# Patient Record
Sex: Male | Born: 1945 | Race: White | Hispanic: No | Marital: Married | Smoking: Former smoker
Health system: Southern US, Community
[De-identification: ages and names within clinical notes are randomized; demographics above are authoritative.]

## PROBLEM LIST (undated history)

## (undated) DIAGNOSIS — G249 Dystonia, unspecified: Secondary | ICD-10-CM

## (undated) DIAGNOSIS — I10 Essential (primary) hypertension: Secondary | ICD-10-CM

## (undated) DIAGNOSIS — E785 Hyperlipidemia, unspecified: Secondary | ICD-10-CM

## (undated) DIAGNOSIS — K219 Gastro-esophageal reflux disease without esophagitis: Secondary | ICD-10-CM

## (undated) HISTORY — DX: Dystonia, unspecified: G24.9

## (undated) HISTORY — DX: Essential (primary) hypertension: I10

## (undated) HISTORY — DX: Gastro-esophageal reflux disease without esophagitis: K21.9

## (undated) HISTORY — PX: HERNIA REPAIR: SHX51

## (undated) HISTORY — DX: Hyperlipidemia, unspecified: E78.5

---

## 1998-05-23 ENCOUNTER — Ambulatory Visit: Admission: RE | Admit: 1998-05-23 | Discharge: 1998-05-23 | Payer: Self-pay | Admitting: Family Medicine

## 1998-08-31 ENCOUNTER — Ambulatory Visit: Admission: RE | Admit: 1998-08-31 | Discharge: 1998-08-31 | Payer: Self-pay | Admitting: Family Medicine

## 2001-09-12 ENCOUNTER — Encounter: Admission: RE | Admit: 2001-09-12 | Discharge: 2001-09-12 | Payer: Self-pay | Admitting: Family Medicine

## 2001-09-12 ENCOUNTER — Encounter: Payer: Self-pay | Admitting: Family Medicine

## 2003-07-11 ENCOUNTER — Encounter: Admission: RE | Admit: 2003-07-11 | Discharge: 2003-07-11 | Payer: Self-pay | Admitting: Gastroenterology

## 2016-06-18 DIAGNOSIS — H2513 Age-related nuclear cataract, bilateral: Secondary | ICD-10-CM | POA: Diagnosis not present

## 2016-06-18 DIAGNOSIS — H1859 Other hereditary corneal dystrophies: Secondary | ICD-10-CM | POA: Diagnosis not present

## 2016-06-26 ENCOUNTER — Other Ambulatory Visit: Payer: Self-pay | Admitting: Family Medicine

## 2016-06-26 DIAGNOSIS — Z0000 Encounter for general adult medical examination without abnormal findings: Secondary | ICD-10-CM | POA: Diagnosis not present

## 2016-06-26 DIAGNOSIS — Z1159 Encounter for screening for other viral diseases: Secondary | ICD-10-CM | POA: Diagnosis not present

## 2016-06-26 DIAGNOSIS — K219 Gastro-esophageal reflux disease without esophagitis: Secondary | ICD-10-CM | POA: Diagnosis not present

## 2016-06-26 DIAGNOSIS — R5383 Other fatigue: Secondary | ICD-10-CM | POA: Diagnosis not present

## 2016-06-26 DIAGNOSIS — R4781 Slurred speech: Secondary | ICD-10-CM | POA: Diagnosis not present

## 2016-06-26 DIAGNOSIS — I129 Hypertensive chronic kidney disease with stage 1 through stage 4 chronic kidney disease, or unspecified chronic kidney disease: Secondary | ICD-10-CM | POA: Diagnosis not present

## 2016-06-26 DIAGNOSIS — E782 Mixed hyperlipidemia: Secondary | ICD-10-CM | POA: Diagnosis not present

## 2016-06-26 DIAGNOSIS — Z125 Encounter for screening for malignant neoplasm of prostate: Secondary | ICD-10-CM | POA: Diagnosis not present

## 2016-06-26 DIAGNOSIS — R269 Unspecified abnormalities of gait and mobility: Secondary | ICD-10-CM | POA: Diagnosis not present

## 2016-06-26 DIAGNOSIS — N183 Chronic kidney disease, stage 3 (moderate): Secondary | ICD-10-CM | POA: Diagnosis not present

## 2016-07-06 ENCOUNTER — Ambulatory Visit
Admission: RE | Admit: 2016-07-06 | Discharge: 2016-07-06 | Disposition: A | Discharge status: Home / Self Care | Payer: Medicare Other | Source: Ambulatory Visit | Attending: Family Medicine | Admitting: Family Medicine

## 2016-07-06 DIAGNOSIS — R4781 Slurred speech: Secondary | ICD-10-CM | POA: Diagnosis not present

## 2016-07-06 MED ORDER — GADOBENATE DIMEGLUMINE 529 MG/ML IV SOLN
16.0000 mL | Freq: Once | INTRAVENOUS | Status: AC | PRN
  Administered 2016-07-06: 16 mL via INTRAVENOUS

## 2016-07-13 DIAGNOSIS — N179 Acute kidney failure, unspecified: Secondary | ICD-10-CM | POA: Diagnosis not present

## 2016-07-13 DIAGNOSIS — N183 Chronic kidney disease, stage 3 (moderate): Secondary | ICD-10-CM | POA: Diagnosis not present

## 2016-08-04 ENCOUNTER — Encounter: Payer: Self-pay | Admitting: Neurology

## 2016-08-04 ENCOUNTER — Other Ambulatory Visit: Payer: Medicare Other

## 2016-08-04 ENCOUNTER — Ambulatory Visit (INDEPENDENT_AMBULATORY_CARE_PROVIDER_SITE_OTHER): Payer: Medicare Other | Admitting: Neurology

## 2016-08-04 VITALS — BP 108/64 | HR 82 | Ht 68.0 in | Wt 179.5 lb

## 2016-08-04 DIAGNOSIS — R2681 Unsteadiness on feet: Secondary | ICD-10-CM

## 2016-08-04 DIAGNOSIS — G629 Polyneuropathy, unspecified: Secondary | ICD-10-CM | POA: Diagnosis not present

## 2016-08-04 LAB — VITAMIN B12: VITAMIN B 12: 719 pg/mL (ref 200–1100)

## 2016-08-04 NOTE — Patient Instructions (Signed)
1. Schedule MRI lumbar spine without contrast 2. Schedule EMG/NCV of both lower extremities with Dr. Allena KatzPatel 3. Bloodwork for CK, RF, aldolase, B12 4. Follow-up after tests

## 2016-08-04 NOTE — Progress Notes (Signed)
NEUROLOGY CONSULTATION NOTE  Joseph Richards MRN: [id number] DOB: [birth year]  Referring provider: Dr. Lupita Raider Primary care provider: Dr. Lupita Raider   Reason for consult:  Gait difficulties  Dear Dr Clelia Croft:  Thank you for your kind referral of Joseph Richards for consultation of the above symptoms. Although his history is well known to you, please allow me to reiterate it for the purpose of our medical record. The patient was accompanied to the clinic by his wife who also provides collateral information. Records and images were personally reviewed where available.  HISTORY OF PRESENT ILLNESS: This is a pleasant 71 year old left-handed man with a history of hypertension, hyperlipidemia, presenting for evaluation of gait difficulties. He and his wife report that he has always had "somewhat of a limp" that becomes more pronounced throughout the day, but over the past 10-12 months, he started having problems where he would feel stiffness in his legs after he sits for a period of time then gets up to walk. He does not necessarily feel it his his joints, he indicates the stiffness and tightness appears to be in his upper thighs extending down the knee when he gets up. He would be awkward when he first gets up, but gets better as the day goes on. He denies any numbness or tingling, no back pain or neck pain. His wife reports that he does have back pain and feels he will need a walker within the year. They deny any falls. No bowel/bladder dysfunction. They have been married more than 38 years and she has always noticed he drags the right foot a little more. He states right foot difficulties were from injury from a pitchfork when he was a child. He has always had difficulties going down stairs. He has a poor sense of balance but is gotten worse. Recently she also noticed that he would have slurred speech that comes and goes a couple of times a day, worse later in the day. He notices he tends to  drool on the right side of his mouth sometimes. There is no family history of similar symptoms. He has occasional sinus headaches, no dizziness, diplopia, dysarthria/dysphagia. There was concern for some right sided facial asymmetry, not noticeable in the office today.   PAST MEDICAL HISTORY:   Past Medical History:  Diagnosis Date  . Hyperlipidemia   . Hypertension     PAST SURGICAL HISTORY: No past surgical history on file.  MEDICATIONS:  Outpatient Encounter Prescriptions as of 08/04/2016  Medication Sig  . aspirin EC 81 MG tablet Take 81 mg by mouth daily.  Marland Kitchen lisinopril (PRINIVIL,ZESTRIL) 10 MG tablet   . Omega-3 Fatty Acids (FISH OIL) 1000 MG CPDR Take by mouth.  Marland Kitchen omeprazole (PRILOSEC) 40 MG capsule   . pravastatin (PRAVACHOL) 20 MG tablet    No facility-administered encounter medications on file as of 08/04/2016.     No current outpatient prescriptions on file prior to visit.   No current facility-administered medications on file prior to visit.     ALLERGIES: No Known Allergies  FAMILY HISTORY: Family History  Problem Relation Age of Onset  . Cancer Sister     SOCIAL HISTORY: Social History   Social History  . Marital status: Married    Spouse name: N/A  . Number of children: N/A  . Years of education: N/A   Occupational History  . Not on file.   Social History Main Topics  . Smoking status: Former Games developer  . Smokeless  tobacco: Current User  . Alcohol use Yes  . Drug use: No  . Sexual activity: Not on file   Other Topics Concern  . Not on file   Social History Narrative  . No narrative on file    REVIEW OF SYSTEMS: Constitutional: No fevers, chills, or sweats, no generalized fatigue, change in appetite Eyes: No visual changes, double vision, eye pain Ear, nose and throat: No hearing loss, ear pain, nasal congestion, sore throat Cardiovascular: No chest pain, palpitations Respiratory:  No shortness of breath at rest or with exertion,  wheezes GastrointestinaI: No nausea, vomiting, diarrhea, abdominal pain, fecal incontinence Genitourinary:  No dysuria, urinary retention or frequency Musculoskeletal:  No neck pain, back pain Integumentary: No rash, pruritus, skin lesions Neurological: as above Psychiatric: No depression, insomnia, anxiety Endocrine: No palpitations, fatigue, diaphoresis, mood swings, change in appetite, change in weight, increased thirst Hematologic/Lymphatic:  No anemia, purpura, petechiae. Allergic/Immunologic: no itchy/runny eyes, nasal congestion, recent allergic reactions, rashes  PHYSICAL EXAM: Vitals:   08/04/16 1257  BP: 108/64  Pulse: 82   General: No acute distress Head:  Normocephalic/atraumatic Eyes: Fundoscopic exam shows bilateral sharp discs, no vessel changes, exudates, or hemorrhages Neck: supple, no paraspinal tenderness, full range of motion Back: No paraspinal tenderness Heart: regular rate and rhythm Lungs: Clear to auscultation bilaterally. Vascular: No carotid bruits. Skin/Extremities: No rash, no edema. Foot deformity noted with left big toe overlapping on 2nd toe Neurological Exam: Mental status: alert and oriented to person, place, and time, no dysarthria or aphasia, Fund of knowledge is appropriate.  Recent and remote memory are intact.  Attention and concentration are normal.    Able to name objects and repeat phrases. Cranial nerves: CN I: not tested CN II: pupils equal, round and reactive to light, visual fields intact, fundi unremarkable. CN III, IV, VI:  full range of motion, no nystagmus, no ptosis CN V: facial sensation intact CN VII: upper and lower face symmetric CN VIII: hearing intact to finger rub CN IX, X: gag intact, uvula midline CN XI: sternocleidomastoid and trapezius muscles intact CN XII: tongue midline Bulk & Tone: normal, no fasciculations. Motor: 5/5 throughout with no pronator drift. During muscle testing, he appears to have some dystonic  right foot inversion (not clearly reproducible at all times) Sensation: decreased vibration to ankles bilaterally, intact to light touch, cold, pin, and joint position sense.  No extinction to double simultaneous stimulation.  Romberg test negative Deep Tendon Reflexes: +1 both UE, brisk +2 bilateral patella, absent ankle jerks, no ankle clonus Plantar responses: downgoing bilaterally Cerebellar: no incoordination on finger to nose, heel to shin. No dysdiadochokinesia Gait: slow, small steps ambulating with knees bent and low clearance of both feet, unable to tandem walk Tremor: none  IMPRESSION: This is a pleasant 71 year old left-handed man with a history of hypertension, hyperlipidemia, presenting for gait difficulties. He has had chronic right foot problems that have caused him to limp, but symptoms have worsened over the past few years, where he notices stiffness in both legs when he first stands up. This improves with activity. 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 The etiology his symptoms is unclear with broad differential diagnosis, it may be multifactorial as well. Bloodwork for CK, RF, aldolase, and B12 will be ordered. EMG/NCV of both lower extremities will be done to further evaluate his symptoms,  MRI lumbar spine without contrast will be ordered to assess for underlying structural abnormality. He was advised to start physical therapy for gait/balance. He will follow-up after the tests.   Thank you for allowing me to participate in the care of this patient. Please do not hesitate to call for any questions or concerns.   Patrcia Dolly, M.D.  CC: Dr. Clelia Croft

## 2016-08-05 LAB — CK: Total CK: 84 U/L (ref 7–232)

## 2016-08-05 LAB — RHEUMATOID FACTOR: Rhuematoid fact SerPl-aCnc: <14 IU/mL (ref <14)

## 2016-08-10 ENCOUNTER — Telehealth: Payer: Self-pay

## 2016-08-10 NOTE — Telephone Encounter (Signed)
-----   Message from Van Clines, MD sent at 08/06/2016  8:41 AM EDT ----- Pls let him know bloodwork is normal, thanks

## 2016-08-10 NOTE — Telephone Encounter (Signed)
Clld pt - LMOVM that lab results are normal. DPR checked.

## 2016-08-11 ENCOUNTER — Encounter: Payer: Self-pay | Admitting: Neurology

## 2016-08-11 ENCOUNTER — Telehealth: Payer: Self-pay | Admitting: Neurology

## 2016-08-11 DIAGNOSIS — R2681 Unsteadiness on feet: Principal | ICD-10-CM

## 2016-08-11 DIAGNOSIS — R269 Unspecified abnormalities of gait and mobility: Secondary | ICD-10-CM | POA: Insufficient documentation

## 2016-08-11 DIAGNOSIS — G629 Polyneuropathy, unspecified: Secondary | ICD-10-CM | POA: Insufficient documentation

## 2016-08-11 NOTE — Telephone Encounter (Signed)
Alvino Chapel at [facility] called in regards to PT and an authorization for a MRI/Dawn CB# [phone]

## 2016-08-11 NOTE — Telephone Encounter (Signed)
Clld [place]/GI back - left auth info on her voicemail

## 2016-08-15 ENCOUNTER — Ambulatory Visit
Admission: RE | Admit: 2016-08-15 | Discharge: 2016-08-15 | Disposition: A | Discharge status: Home / Self Care | Payer: Medicare Other | Source: Ambulatory Visit | Attending: Neurology | Admitting: Neurology

## 2016-08-15 DIAGNOSIS — M48061 Spinal stenosis, lumbar region without neurogenic claudication: Secondary | ICD-10-CM | POA: Diagnosis not present

## 2016-08-18 ENCOUNTER — Ambulatory Visit (INDEPENDENT_AMBULATORY_CARE_PROVIDER_SITE_OTHER): Payer: Medicare Other | Admitting: Neurology

## 2016-08-18 DIAGNOSIS — G629 Polyneuropathy, unspecified: Secondary | ICD-10-CM

## 2016-08-18 DIAGNOSIS — R2681 Unsteadiness on feet: Secondary | ICD-10-CM

## 2016-08-18 NOTE — Procedures (Signed)
[facility]  [address] [place], [address]  [place], [place] [address] Tel: [phone] Fax:  [phone] Test Date:  08/18/2016  Patient: Joseph Richards DOB: 1946/04/11 Physician: Nita Sickle, DO  Sex: Male Height:  Ref Phys: Patrcia Dolly, M.D.  ID#: [id number] Temp: 36.4C Technician:    Patient Complaints: This is a 71 year-old gentleman referred for evaluation of gait abnormality and leg stiffness.    NCV & EMG Findings: Extensive electrodiagnostic testing of the right lower extremity and additional studies of the left shows:  1. Bilateral sural and superficial peroneal sensory responses are within normal limits. 2. Bilateral peroneal and tibial motor responses are within normal limits. 3. Bilateral tibial H reflex studies are mildly prolonged. These findings are of unclear clinical significance in isolation. 4. There is no evidence of active or chronic motor axon loss changes affecting any of the tested muscles. Motor unit configuration and recruitment pattern is within normal limits.  Impression: This is a normal study of the lower extremities. In particular, there is no evidence of a large fiber sensorimotor polyneuropathy or lumbosacral radiculopathy.   ___________________________ Nita Sickle, DO    Nerve Conduction Studies Anti Sensory Summary Table   Stim Site NR Peak (ms) Norm Peak (ms) P-T Amp (V) Norm P-T Amp  Left Sup Peroneal Anti Sensory (Ant Lat Mall)  12 cm    2.8 <4.6 10.3 >3  Right Sup Peroneal Anti Sensory (Ant Lat Mall)  12 cm    2.5 <4.6 11.4 >3  Left Sural Anti Sensory (Lat Mall)  Calf    3.8 <4.6 7.2 >3  Right Sural Anti Sensory (Lat Mall)  Calf    3.9 <4.6 8.2 >3   Motor Summary Table   Stim Site NR Onset (ms) Norm Onset (ms) O-P Amp (mV) Norm O-P Amp Site1 Site2 Delta-0 (ms) Dist (cm) Vel (m/s) Norm Vel (m/s)  Left Peroneal Motor (Ext Dig Brev)  Ankle    4.3 <6.0 3.8 >2.5 B Fib Ankle 7.3 35.0 48 >40  B Fib    11.6  3.3  Poplt  B Fib 1.8 9.0 50 >40  Poplt    13.4  3.2         Right Peroneal Motor (Ext Dig Brev)  Ankle    3.1 <6.0 5.9 >2.5 B Fib Ankle 7.7 37.0 48 >40  B Fib    10.8  5.8  Poplt B Fib 1.9 10.0 53 >40  Poplt    12.7  5.5         Left Tibial Motor (Abd Hall Brev)  Ankle    4.5 <6.0 5.3 >4 Knee Ankle 8.5 37.0 44 >40  Knee    13.0  2.5         Right Tibial Motor (Abd Hall Brev)  Ankle    5.1 <6.0 9.7 >4 Knee Ankle 7.7 38.0 49 >40  Knee    12.8  6.2          H Reflex Studies   NR H-Lat (ms) Lat Norm (ms) L-R H-Lat (ms)  Left Tibial (Gastroc)     35.78 <35 1.50  Right Tibial (Gastroc)     37.28 <35 1.50   EMG   Side Muscle Ins Act Fibs Psw Fasc Number Recrt Dur Dur. Amp Amp. Poly Poly. Comment  Left AntTibialis Nml Nml Nml Nml Nml Nml Nml Nml Nml Nml Nml Nml N/A  Left Gastroc Nml Nml Nml Nml Nml Nml Nml Nml Nml Nml Nml Nml N/A  Left Flex Dig Long Nml Nml Nml Nml Nml Nml Nml Nml Nml Nml Nml Nml N/A  Left RectFemoris Nml Nml Nml Nml Nml Nml Nml Nml Nml Nml Nml Nml N/A  Left GluteusMed Nml Nml Nml Nml Nml Nml Nml Nml Nml Nml Nml Nml N/A  Left BicepsFemS Nml Nml Nml Nml Nml Nml Nml Nml Nml Nml Nml Nml N/A  Right BicepsFemS Nml Nml Nml Nml Nml Nml Nml Nml Nml Nml Nml Nml N/A  Right AntTibialis Nml Nml Nml Nml Nml Nml Nml Nml Nml Nml Nml Nml N/A  Right Gastroc Nml Nml Nml Nml Nml Nml Nml Nml Nml Nml Nml Nml N/A  Right Flex Dig Long Nml Nml Nml Nml Nml Nml Nml Nml Nml Nml Nml Nml N/A  Right RectFemoris Nml Nml Nml Nml Nml Nml Nml Nml Nml Nml Nml Nml N/A  Right GluteusMed Nml Nml Nml Nml Nml Nml Nml Nml Nml Nml Nml Nml N/A      Waveforms:

## 2016-08-21 ENCOUNTER — Telehealth: Payer: Self-pay | Admitting: Neurology

## 2016-08-21 NOTE — Telephone Encounter (Signed)
Left VM re: normal EMG and unremarkable MRI lumbar spine. He has a f/u on 4/30, will discuss further mgt then, he may benefit from starting Sinemet, there appeared to be dystonia of right foot.

## 2016-09-07 ENCOUNTER — Ambulatory Visit (INDEPENDENT_AMBULATORY_CARE_PROVIDER_SITE_OTHER): Payer: Medicare Other | Admitting: Neurology

## 2016-09-07 ENCOUNTER — Encounter: Payer: Self-pay | Admitting: Neurology

## 2016-09-07 VITALS — BP 114/68 | HR 68 | Temp 97.6°F | Ht 67.5 in | Wt 178.0 lb

## 2016-09-07 DIAGNOSIS — R2681 Unsteadiness on feet: Secondary | ICD-10-CM

## 2016-09-07 DIAGNOSIS — G249 Dystonia, unspecified: Secondary | ICD-10-CM

## 2016-09-07 MED ORDER — CARBIDOPA-LEVODOPA 25-100 MG PO TABS: 1.0000 | ORAL_TABLET | Freq: Three times a day (TID) | ORAL | 4 refills | Status: DC

## 2016-09-07 NOTE — Progress Notes (Signed)
NEUROLOGY FOLLOW UP OFFICE NOTE  Joseph Richards [id number] 04-Aug-1945  HISTORY OF PRESENT ILLNESS: I had the pleasure of seeing Joseph Richards in follow-up in the neurology clinic on 09/07/2016.  The patient was last seen a month ago for gait difficulties and is accompanied by his daughter who helps supplement the history today.  Records and images were personally reviewed where available. I personally reviewed MRI lumbar spine without contrast which did not show any acute changes, there was no evidence of nerve impingement or lower cord myelopathy, there were mild degenerative changes. EMG/NCV of both lower extremities was normal, no evidence of neuropathy or myelopathy. Bloodwork showed normal CK, RF, and B12. He continues to report that the front of his thighs feels stiff when he has been immobile for a prolonged period. He denies any falls.  HPI 08/04/2016: This is a pleasant 71 yo LH man with a history of hypertension, hyperlipidemia, who presented with gait difficulties. He and his wife report that he has always had "somewhat of a limp" that becomes more pronounced throughout the day, but over the past 10-12 months, he started having problems where he would feel stiffness in his legs after he sits for a period of time then gets up to walk. He does not necessarily feel it his his joints, he indicates the stiffness and tightness appears to be in his upper thighs extending down the knee when he gets up. He would be awkward when he first gets up, but gets better as the day goes on. He denies any numbness or tingling, no back pain or neck pain. His wife reports that he does have back pain and feels he will need a walker within the year. They deny any falls. No bowel/bladder dysfunction. They have been married more than 38 years and she has always noticed he drags the right foot a little more. He states right foot difficulties were from injury from a pitchfork when he was a child. He has always had  difficulties going down stairs. He has a poor sense of balance but is gotten worse. Recently she also noticed that he would have slurred speech that comes and goes a couple of times a day, worse later in the day. He notices he tends to drool on the right side of his mouth sometimes. There is no family history of similar symptoms. He has occasional sinus headaches, no dizziness, diplopia, dysarthria/dysphagia. There was concern for some right sided facial asymmetry, not noticeable in the office today.   PAST MEDICAL HISTORY: Past Medical History:  Diagnosis Date  . Hyperlipidemia   . Hypertension     MEDICATIONS: Current Outpatient Prescriptions on File Prior to Visit  Medication Sig Dispense Refill  . aspirin EC 81 MG tablet Take 81 mg by mouth daily.    Marland Kitchen lisinopril (PRINIVIL,ZESTRIL) 10 MG tablet     . Omega-3 Fatty Acids (FISH OIL) 1000 MG CPDR Take by mouth.    Marland Kitchen omeprazole (PRILOSEC) 40 MG capsule     . pravastatin (PRAVACHOL) 20 MG tablet      No current facility-administered medications on file prior to visit.     ALLERGIES: No Known Allergies  FAMILY HISTORY: Family History  Problem Relation Age of Onset  . Cancer Sister     SOCIAL HISTORY: Social History   Social History  . Marital status: Married    Spouse name: N/A  . Number of children: N/A  . Years of education: N/A   Occupational History  .  Not on file.   Social History Main Topics  . Smoking status: Former Games developer  . Smokeless tobacco: Current User  . Alcohol use Yes  . Drug use: No  . Sexual activity: Not on file   Other Topics Concern  . Not on file   Social History Narrative  . No narrative on file    REVIEW OF SYSTEMS: Constitutional: No fevers, chills, or sweats, no generalized fatigue, change in appetite Eyes: No visual changes, double vision, eye pain Ear, nose and throat: No hearing loss, ear pain, nasal congestion, sore throat Cardiovascular: No chest pain, palpitations Respiratory:   No shortness of breath at rest or with exertion, wheezes GastrointestinaI: No nausea, vomiting, diarrhea, abdominal pain, fecal incontinence Genitourinary:  No dysuria, urinary retention or frequency Musculoskeletal:  No neck pain, back pain Integumentary: No rash, pruritus, skin lesions Neurological: as above Psychiatric: No depression, insomnia, anxiety Endocrine: No palpitations, fatigue, diaphoresis, mood swings, change in appetite, change in weight, increased thirst Hematologic/Lymphatic:  No anemia, purpura, petechiae. Allergic/Immunologic: no itchy/runny eyes, nasal congestion, recent allergic reactions, rashes  PHYSICAL EXAM: Vitals:   09/07/16 1355  BP: 114/68  Pulse: 68  Temp: 97.6 F (36.4 C)   General: No acute distress Head:  Normocephalic/atraumatic Neck: supple, no paraspinal tenderness, full range of motion Heart:  Regular rate and rhythm Lungs:  Clear to auscultation bilaterally Back: No paraspinal tenderness Skin/Extremities: No rash, no edema Neurological Exam: alert and oriented to person, place, and time, no dysarthria or aphasia, Fund of knowledge is appropriate.  Recent and remote memory are intact.  Attention and concentration are normal.    Able to name objects and repeat phrases. Cranial nerves: CN I: not tested CN II: pupils equal, round and reactive to light, visual fields intact, fundi unremarkable. CN III, IV, VI:  full range of motion, no nystagmus, no ptosis CN V: facial sensation intact CN VII: upper and lower face symmetric CN VIII: hearing intact to finger rub CN IX, X: gag intact, uvula midline CN XI: sternocleidomastoid and trapezius muscles intact CN XII: tongue midline Bulk & Tone: normal, no cogwheeling, no fasciculations. Motor: 5/5 throughout with no pronator drift. During muscle testing, he again is noted to have some dystonic right foot inversion (not clearly reproducible at all times) Sensation: intact to light touch.  No extinction  to double simultaneous stimulation.  Romberg test negative Deep Tendon Reflexes: +1 both UE, brisk +2 bilateral patella, absent ankle jerks, no ankle clonus Plantar responses: downgoing bilaterally Cerebellar: no incoordination on finger to nose testing Gait: slow, small steps ambulating with knees bent and low clearance of both feet, unable to tandem walk (similar to prior) Tremor: none No postural instability, regular foot and finger tapping   IMPRESSION: This is a pleasant 71 yo LH man with a history of hypertension, hyperlipidemia, presenting for gait difficulties. He has had chronic right foot problems that have caused him to limp, but symptoms have worsened over the past few years, where he notices stiffness in both legs when he first stands up. This improves with activity. 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 MRI lumbar spine and EMG/NCV of both legs were unremarkable. CK, RF, and B12 levels normal. The etiology his symptoms is unclear, consideration for early Parkinson's disease with dystonia and stiffness. He is agreeable to starting a trial with low dose Sinemet 25/100mg   1/2 tablet three times a day. He will call our office for an update in a month, we may further uptitrate Sinemet dose as tolerated. He has not been to PT yet, awaiting results of testing, proceed with PT as discussed. He will follow-up in 3 months and knows to call for any changes.   Thank you for allowing me to participate in his care.  Please do not hesitate to call for any questions or concerns.  The duration of this appointment visit was 25 minutes of face-to-face time with the patient.  Greater than 50% of this time was spent in counseling, explanation of diagnosis, planning of further management, and coordination of care.   Patrcia Dolly, M.D.   CC: Dr.  Clelia Croft

## 2016-09-07 NOTE — Patient Instructions (Signed)
1. Start Sinemet 25/100: Take 1/2 tablet three times a day before meals. Call our office after a month for an update, we may increase dose depending on how you are doing 2. Proceed with PT for balance therapy 3. Follow-up in 3 months, call for any changes

## 2016-09-29 DIAGNOSIS — R809 Proteinuria, unspecified: Secondary | ICD-10-CM | POA: Diagnosis not present

## 2016-09-29 DIAGNOSIS — E782 Mixed hyperlipidemia: Secondary | ICD-10-CM | POA: Diagnosis not present

## 2016-10-09 ENCOUNTER — Telehealth: Payer: Self-pay | Admitting: Neurology

## 2016-10-09 NOTE — Telephone Encounter (Signed)
Returned pt call.  He states he has not noticed a significant difference while taking Sinemet 25-100Mg  TID.  He states that his legs feel slightly less tight but are "heavy" in the latter part of the day.  Let him know that I would send the message to Dr. Karel JarvisAquino and call him back with her response.  He states that he will continue to take the Sinemet as directed for the time being.

## 2016-10-09 NOTE — Telephone Encounter (Signed)
PT called and wanted to give Dr Karel JarvisAquino his 30 day update on the medication she prescribed for him

## 2016-10-12 DIAGNOSIS — R262 Difficulty in walking, not elsewhere classified: Secondary | ICD-10-CM | POA: Diagnosis not present

## 2016-10-12 DIAGNOSIS — R269 Unspecified abnormalities of gait and mobility: Secondary | ICD-10-CM | POA: Diagnosis not present

## 2016-10-15 DIAGNOSIS — R262 Difficulty in walking, not elsewhere classified: Secondary | ICD-10-CM | POA: Diagnosis not present

## 2016-10-15 DIAGNOSIS — R269 Unspecified abnormalities of gait and mobility: Secondary | ICD-10-CM | POA: Diagnosis not present

## 2016-10-20 DIAGNOSIS — R262 Difficulty in walking, not elsewhere classified: Secondary | ICD-10-CM | POA: Diagnosis not present

## 2016-10-20 DIAGNOSIS — R269 Unspecified abnormalities of gait and mobility: Secondary | ICD-10-CM | POA: Diagnosis not present

## 2016-10-22 DIAGNOSIS — R262 Difficulty in walking, not elsewhere classified: Secondary | ICD-10-CM | POA: Diagnosis not present

## 2016-10-22 DIAGNOSIS — R269 Unspecified abnormalities of gait and mobility: Secondary | ICD-10-CM | POA: Diagnosis not present

## 2016-10-27 DIAGNOSIS — R269 Unspecified abnormalities of gait and mobility: Secondary | ICD-10-CM | POA: Diagnosis not present

## 2016-10-27 DIAGNOSIS — R262 Difficulty in walking, not elsewhere classified: Secondary | ICD-10-CM | POA: Diagnosis not present

## 2016-10-29 DIAGNOSIS — R269 Unspecified abnormalities of gait and mobility: Secondary | ICD-10-CM | POA: Diagnosis not present

## 2016-10-29 DIAGNOSIS — R262 Difficulty in walking, not elsewhere classified: Secondary | ICD-10-CM | POA: Diagnosis not present

## 2016-11-03 ENCOUNTER — Telehealth: Payer: Self-pay | Admitting: Neurology

## 2016-11-03 DIAGNOSIS — R269 Unspecified abnormalities of gait and mobility: Secondary | ICD-10-CM | POA: Diagnosis not present

## 2016-11-03 DIAGNOSIS — G249 Dystonia, unspecified: Secondary | ICD-10-CM

## 2016-11-03 DIAGNOSIS — R262 Difficulty in walking, not elsewhere classified: Secondary | ICD-10-CM | POA: Diagnosis not present

## 2016-11-03 NOTE — Telephone Encounter (Signed)
Caller: Rayna Sextonalph   Urgent? No  Reason for the call: Wanting to update Dr. Karel JarvisAquino on his medication Carbidopa Levodopa. He said he cannot tell one way or the other if the medication is helping. He is not sure if the dosage needs to be increased or try another medication. Please call. Thanks

## 2016-11-04 MED ORDER — CARBIDOPA-LEVODOPA 25-100 MG PO TABS: ORAL_TABLET | ORAL | 4 refills | Status: DC

## 2016-11-04 NOTE — Telephone Encounter (Signed)
PT called and said he has not received a call back in regards to yesterday's message

## 2016-11-04 NOTE — Telephone Encounter (Signed)
Spoke to patient, he has not noticed any improvement with 3 weeks of PT and 1/2 tab TID of Sinemet. No side effects. Instructed to increase to 1 tab TID. He will update in a month again. Rx sent to pharmacy.

## 2016-11-05 DIAGNOSIS — R262 Difficulty in walking, not elsewhere classified: Secondary | ICD-10-CM | POA: Diagnosis not present

## 2016-11-05 DIAGNOSIS — R269 Unspecified abnormalities of gait and mobility: Secondary | ICD-10-CM | POA: Diagnosis not present

## 2016-11-17 DIAGNOSIS — R262 Difficulty in walking, not elsewhere classified: Secondary | ICD-10-CM | POA: Diagnosis not present

## 2016-11-17 DIAGNOSIS — R269 Unspecified abnormalities of gait and mobility: Secondary | ICD-10-CM | POA: Diagnosis not present

## 2016-11-19 DIAGNOSIS — R269 Unspecified abnormalities of gait and mobility: Secondary | ICD-10-CM | POA: Diagnosis not present

## 2016-11-19 DIAGNOSIS — R262 Difficulty in walking, not elsewhere classified: Secondary | ICD-10-CM | POA: Diagnosis not present

## 2016-11-24 DIAGNOSIS — R262 Difficulty in walking, not elsewhere classified: Secondary | ICD-10-CM | POA: Diagnosis not present

## 2016-11-24 DIAGNOSIS — R269 Unspecified abnormalities of gait and mobility: Secondary | ICD-10-CM | POA: Diagnosis not present

## 2016-11-25 ENCOUNTER — Telehealth: Payer: Self-pay | Admitting: Neurology

## 2016-11-25 NOTE — Telephone Encounter (Signed)
MRI brain with and without contrast 07/06/2016: mild to moderate chronic microvascular disease. Mild global atrophy without hydrocephalus. 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

## 2016-11-26 DIAGNOSIS — R269 Unspecified abnormalities of gait and mobility: Secondary | ICD-10-CM | POA: Diagnosis not present

## 2016-11-26 DIAGNOSIS — R262 Difficulty in walking, not elsewhere classified: Secondary | ICD-10-CM | POA: Diagnosis not present

## 2016-12-01 DIAGNOSIS — R269 Unspecified abnormalities of gait and mobility: Secondary | ICD-10-CM | POA: Diagnosis not present

## 2016-12-01 DIAGNOSIS — R262 Difficulty in walking, not elsewhere classified: Secondary | ICD-10-CM | POA: Diagnosis not present

## 2016-12-03 DIAGNOSIS — R262 Difficulty in walking, not elsewhere classified: Secondary | ICD-10-CM | POA: Diagnosis not present

## 2016-12-03 DIAGNOSIS — R269 Unspecified abnormalities of gait and mobility: Secondary | ICD-10-CM | POA: Diagnosis not present

## 2016-12-09 DIAGNOSIS — R269 Unspecified abnormalities of gait and mobility: Secondary | ICD-10-CM | POA: Diagnosis not present

## 2016-12-09 DIAGNOSIS — R262 Difficulty in walking, not elsewhere classified: Secondary | ICD-10-CM | POA: Diagnosis not present

## 2016-12-11 ENCOUNTER — Encounter: Payer: Self-pay | Admitting: Neurology

## 2016-12-11 ENCOUNTER — Ambulatory Visit (INDEPENDENT_AMBULATORY_CARE_PROVIDER_SITE_OTHER): Payer: Medicare Other | Admitting: Neurology

## 2016-12-11 VITALS — BP 102/64 | HR 81 | Ht 68.0 in | Wt 172.0 lb

## 2016-12-11 DIAGNOSIS — R2681 Unsteadiness on feet: Secondary | ICD-10-CM

## 2016-12-11 DIAGNOSIS — G249 Dystonia, unspecified: Secondary | ICD-10-CM | POA: Diagnosis not present

## 2016-12-11 MED ORDER — CARBIDOPA-LEVODOPA 25-100 MG PO TABS: ORAL_TABLET | ORAL | 6 refills | Status: DC

## 2016-12-11 NOTE — Patient Instructions (Signed)
1. Increase Sinemet 25/100mg : take 1.5 tablets three times a day before meals 2. Continue with PT, pls have them send me their re-evaluation summary and we will proceed from there with regards to further PT 3. Continue home exercises 4. Follow-up in 4-5 months, call for any changes

## 2016-12-11 NOTE — Progress Notes (Signed)
NEUROLOGY FOLLOW UP OFFICE NOTE  Joseph Richards [id number] November 07, 1945  HISTORY OF PRESENT ILLNESS: I had the pleasure of seeing Joseph Richards in follow-up in the neurology clinic on 12/11/2016.  The patient was last seen 3 months ago for gait difficulties and is accompanied by his wife who helps supplement the history today. Since his last visit, Sinemet dose was increase to 1 tab TID. He continues to do physical therapy, 90% of the time working on leg strength. He states that he does not feel any real difference, except that the tightness he was having over his shins when he gets up are not there anymore. He is not sure if this is due to PT or the Sinemet, he feels it may be due to medication, because he does notice he feels much better for a day after PT, then afterwards his legs feel heavy again but without the tightness. PT also noticed his right foot would turn in. He still has a tendency to drag his feet, R>L, but his legs feel stronger. His wife has noticed a difference in his walking since starting PT, "not dramatic," but when he did not do PT during the July 4th holiday, she noticed a regression. He denies any falls.   HPI 08/04/2016: This is a pleasant 71 yo LH man with a history of hypertension, hyperlipidemia, who presented with gait difficulties. He and his wife report that he has always had "somewhat of a limp" that becomes more pronounced throughout the day, but over the past 10-12 months, he started having problems where he would feel stiffness in his legs after he sits for a period of time then gets up to walk. He does not necessarily feel it his his joints, he indicates the stiffness and tightness appears to be in his upper thighs extending down the knee when he gets up. He would be awkward when he first gets up, but gets better as the day goes on. He denies any numbness or tingling, no back pain or neck pain. His wife reports that he does have back pain and feels he will need a walker  within the year. They deny any falls. No bowel/bladder dysfunction. They have been married more than 38 years and she has always noticed he drags the right foot a little more. He states right foot difficulties were from injury from a pitchfork when he was a child. He has always had difficulties going down stairs. He has a poor sense of balance but is gotten worse. Recently she also noticed that he would have slurred speech that comes and goes a couple of times a day, worse later in the day. He notices he tends to drool on the right side of his mouth sometimes. There is no family history of similar symptoms. He has occasional sinus headaches, no dizziness, diplopia, dysarthria/dysphagia. There was concern for some right sided facial asymmetry, not noticeable in the office today.   Diagnostic Data: I personally reviewed MRI lumbar spine without contrast which did not show any acute changes, there was no evidence of nerve impingement or lower cord myelopathy, there were mild degenerative changes. EMG/NCV of both lower extremities was normal, no evidence of neuropathy or myelopathy. Bloodwork showed normal CK, RF, and B12.   PAST MEDICAL HISTORY: Past Medical History:  Diagnosis Date  . Hyperlipidemia   . Hypertension     MEDICATIONS: Current Outpatient Prescriptions on File Prior to Visit  Medication Sig Dispense Refill  . aspirin EC 81 MG tablet  Take 81 mg by mouth daily.    . carbidopa-levodopa (SINEMET IR) 25-100 MG tablet Take 1 tablet three times a day before meals 90 tablet 4  . lisinopril (PRINIVIL,ZESTRIL) 10 MG tablet     . Omega-3 Fatty Acids (FISH OIL) 1000 MG CPDR Take by mouth.    Marland Kitchen omeprazole (PRILOSEC) 40 MG capsule     . pravastatin (PRAVACHOL) 20 MG tablet      No current facility-administered medications on file prior to visit.     ALLERGIES: No Known Allergies  FAMILY HISTORY: Family History  Problem Relation Age of Onset  . Cancer Sister     SOCIAL HISTORY: Social  History   Social History  . Marital status: Married    Spouse name: N/A  . Number of children: N/A  . Years of education: N/A   Occupational History  . Not on file.   Social History Main Topics  . Smoking status: Former Games developer  . Smokeless tobacco: Current User  . Alcohol use Yes  . Drug use: No  . Sexual activity: Not on file   Other Topics Concern  . Not on file   Social History Narrative  . No narrative on file    REVIEW OF SYSTEMS: Constitutional: No fevers, chills, or sweats, no generalized fatigue, change in appetite Eyes: No visual changes, double vision, eye pain Ear, nose and throat: No hearing loss, ear pain, nasal congestion, sore throat Cardiovascular: No chest pain, palpitations Respiratory:  No shortness of breath at rest or with exertion, wheezes GastrointestinaI: No nausea, vomiting, diarrhea, abdominal pain, fecal incontinence Genitourinary:  No dysuria, urinary retention or frequency Musculoskeletal:  No neck pain, back pain Integumentary: No rash, pruritus, skin lesions Neurological: as above Psychiatric: No depression, insomnia, anxiety Endocrine: No palpitations, fatigue, diaphoresis, mood swings, change in appetite, change in weight, increased thirst Hematologic/Lymphatic:  No anemia, purpura, petechiae. Allergic/Immunologic: no itchy/runny eyes, nasal congestion, recent allergic reactions, rashes  PHYSICAL EXAM: Vitals:   12/11/16 1334  BP: 102/64  Pulse: 81   General: No acute distress Head:  Normocephalic/atraumatic Neck: supple, no paraspinal tenderness, full range of motion Heart:  Regular rate and rhythm Lungs:  Clear to auscultation bilaterally Back: No paraspinal tenderness Skin/Extremities: No rash, no edema Neurological Exam: alert and oriented to person, place, and time, no dysarthria or aphasia, Fund of knowledge is appropriate.  Recent and remote memory are intact.  Attention and concentration are normal.    Able to name objects  and repeat phrases. Cranial nerves: CN I: not tested CN II: pupils equal, round and reactive to light, visual fields intact, fundi unremarkable. CN III, IV, VI:  full range of motion, no nystagmus, no ptosis CN V: facial sensation intact CN VII: upper and lower face symmetric CN VIII: hearing intact to finger rub CN IX, X: gag intact, uvula midline CN XI: sternocleidomastoid and trapezius muscles intact CN XII: tongue midline Bulk & Tone: normal, no cogwheeling, no fasciculations. Motor: 5/5 throughout with no pronator drift. During muscle testing, dystonic right foot inversion noted on prior visits is not seen today Sensation: intact to light touch.  No extinction to double simultaneous stimulation.  Romberg test negative Deep Tendon Reflexes: +1 both UE, brisk +2 bilateral patella, absent ankle jerks, no ankle clonus Plantar responses: downgoing bilaterally Cerebellar: no incoordination on finger to nose testing Gait: slow, small steps ambulating with knees bent and low clearance of both feet, unable to tandem walk (similar to prior) Tremor: none No postural instability, regular  foot and finger tapping  IMPRESSION: This is a pleasant 71 yo LH man with a history of hypertension, hyperlipidemia, presenting for gait difficulties. He has had chronic right foot problems that have caused him to limp, but symptoms have worsened over the past few years, where he notices stiffness in both legs when he first stands up. This improves with activity. His exam previously had shown mild neuropathy, brisk patellar reflexes, possible dystonia of the right foot but not clearly reproducible each time, as well as small steps with low clearance as he ambulates. MRI lumbar spine and EMG/NCV of both legs were unremarkable. CK, RF, and B12 levels normal. The etiology his symptoms is unclear, consideration for early Parkinson's disease with dystonia and stiffness. He was started on Sinemet 25/100mg  TID and has been  doing PT, and reports the stiffness in his legs is better. His legs still feel different, however the dystonia noted on previous visits is not seen today. I believe there has been some improvement in symptoms, which he and his wife agree with. We discussed increasing Sinemet dose to 1.5 tabs TID. Continue with PT, he feels it has been very helpful and notices a difference when unable to do PT sessions. If re-evaluation towards the end of this period still indicates need for further PT, we will send another order. Continue HEP. He will follow-up in 4-5 months and knows to call for any changes.   Thank you for allowing me to participate in his care.  Please do not hesitate to call for any questions or concerns.  The duration of this appointment visit was 25 minutes of face-to-face time with the patient.  Greater than 50% of this time was spent in counseling, explanation of diagnosis, planning of further management, and coordination of care.   Patrcia DollyKaren Aquino, M.D.   CC: Dr. Clelia CroftShaw

## 2016-12-15 DIAGNOSIS — R262 Difficulty in walking, not elsewhere classified: Secondary | ICD-10-CM | POA: Diagnosis not present

## 2016-12-15 DIAGNOSIS — R269 Unspecified abnormalities of gait and mobility: Secondary | ICD-10-CM | POA: Diagnosis not present

## 2016-12-23 DIAGNOSIS — R262 Difficulty in walking, not elsewhere classified: Secondary | ICD-10-CM | POA: Diagnosis not present

## 2016-12-23 DIAGNOSIS — R269 Unspecified abnormalities of gait and mobility: Secondary | ICD-10-CM | POA: Diagnosis not present

## 2017-01-05 DIAGNOSIS — R262 Difficulty in walking, not elsewhere classified: Secondary | ICD-10-CM | POA: Diagnosis not present

## 2017-01-05 DIAGNOSIS — R269 Unspecified abnormalities of gait and mobility: Secondary | ICD-10-CM | POA: Diagnosis not present

## 2017-01-06 DIAGNOSIS — I129 Hypertensive chronic kidney disease with stage 1 through stage 4 chronic kidney disease, or unspecified chronic kidney disease: Secondary | ICD-10-CM | POA: Diagnosis not present

## 2017-01-06 DIAGNOSIS — N183 Chronic kidney disease, stage 3 (moderate): Secondary | ICD-10-CM | POA: Diagnosis not present

## 2017-01-06 DIAGNOSIS — R269 Unspecified abnormalities of gait and mobility: Secondary | ICD-10-CM | POA: Diagnosis not present

## 2017-01-06 DIAGNOSIS — E782 Mixed hyperlipidemia: Secondary | ICD-10-CM | POA: Diagnosis not present

## 2017-01-13 DIAGNOSIS — R262 Difficulty in walking, not elsewhere classified: Secondary | ICD-10-CM | POA: Diagnosis not present

## 2017-01-13 DIAGNOSIS — R269 Unspecified abnormalities of gait and mobility: Secondary | ICD-10-CM | POA: Diagnosis not present

## 2017-01-20 DIAGNOSIS — R269 Unspecified abnormalities of gait and mobility: Secondary | ICD-10-CM | POA: Diagnosis not present

## 2017-01-20 DIAGNOSIS — R262 Difficulty in walking, not elsewhere classified: Secondary | ICD-10-CM | POA: Diagnosis not present

## 2017-01-27 DIAGNOSIS — R269 Unspecified abnormalities of gait and mobility: Secondary | ICD-10-CM | POA: Diagnosis not present

## 2017-01-27 DIAGNOSIS — R262 Difficulty in walking, not elsewhere classified: Secondary | ICD-10-CM | POA: Diagnosis not present

## 2017-02-03 DIAGNOSIS — R262 Difficulty in walking, not elsewhere classified: Secondary | ICD-10-CM | POA: Diagnosis not present

## 2017-02-03 DIAGNOSIS — R269 Unspecified abnormalities of gait and mobility: Secondary | ICD-10-CM | POA: Diagnosis not present

## 2017-02-10 DIAGNOSIS — R269 Unspecified abnormalities of gait and mobility: Secondary | ICD-10-CM | POA: Diagnosis not present

## 2017-02-10 DIAGNOSIS — R262 Difficulty in walking, not elsewhere classified: Secondary | ICD-10-CM | POA: Diagnosis not present

## 2017-02-16 DIAGNOSIS — Z23 Encounter for immunization: Secondary | ICD-10-CM | POA: Diagnosis not present

## 2017-02-17 DIAGNOSIS — R269 Unspecified abnormalities of gait and mobility: Secondary | ICD-10-CM | POA: Diagnosis not present

## 2017-02-17 DIAGNOSIS — R262 Difficulty in walking, not elsewhere classified: Secondary | ICD-10-CM | POA: Diagnosis not present

## 2017-02-24 DIAGNOSIS — R262 Difficulty in walking, not elsewhere classified: Secondary | ICD-10-CM | POA: Diagnosis not present

## 2017-02-24 DIAGNOSIS — R269 Unspecified abnormalities of gait and mobility: Secondary | ICD-10-CM | POA: Diagnosis not present

## 2017-05-12 ENCOUNTER — Ambulatory Visit (INDEPENDENT_AMBULATORY_CARE_PROVIDER_SITE_OTHER): Payer: Medicare Other | Admitting: Neurology

## 2017-05-12 ENCOUNTER — Encounter: Payer: Self-pay | Admitting: Neurology

## 2017-05-12 VITALS — BP 112/70 | HR 80 | Ht 68.5 in | Wt 176.0 lb

## 2017-05-12 DIAGNOSIS — R2681 Unsteadiness on feet: Secondary | ICD-10-CM

## 2017-05-12 DIAGNOSIS — G249 Dystonia, unspecified: Secondary | ICD-10-CM | POA: Diagnosis not present

## 2017-05-12 MED ORDER — CARBIDOPA-LEVODOPA 25-100 MG PO TABS: ORAL_TABLET | ORAL | 6 refills | Status: DC

## 2017-05-12 NOTE — Progress Notes (Signed)
NEUROLOGY FOLLOW UP OFFICE NOTE  [name] [id number] [birth year]  HISTORY OF PRESENT ILLNESS: I had the pleasure of seeing Joseph Richards in follow-up in the neurology clinic on 05/12/2016.  The patient was last seen 5 months ago for gait difficulties and is accompanied by his wife who helps supplement the history today. Since his last visit, Sinemet dose was increase to 1.5 tabs TID. He has completed physical therapy and continues to do exercises at the [facility]. He has not noticed any significant difference with his right foot turning in. He feels the stiffness is about the same as since his last visit. He still notices stiffness at certain times, such as when he is seated for a while then gets up to move and walk. [facility]ing and stationary bike exercise. He denies any falls. He does not use his cane very often. He feels his upper extremities are fine. He denies any tremors, no neck/back pains.  HPI 08/04/2016: This is a pleasant 72 yo LH man with a history of hypertension, hyperlipidemia, who presented with gait difficulties. He and his wife report that he has always had "somewhat of a limp" that becomes more pronounced throughout the day, but over the past 10-12 months, he started having problems where he would feel stiffness in his legs after he sits for a period of time then gets up to walk. He does not necessarily feel it his his joints, he indicates the stiffness and tightness appears to be in his upper thighs extending down the knee when he gets up. He would be awkward when he first gets up, but gets better as the day goes on. He denies any numbness or tingling, no back pain or neck pain. His wife reports that he does have back pain and feels he will need a walker within the year. They deny any falls. No bowel/bladder dysfunction. They have been married more than 38 years and she has always noticed he drags the right foot a little more. He states right  foot difficulties were from injury from a pitchfork when he was a child. He has always had difficulties going down stairs. He has a poor sense of balance but is gotten worse. Recently she also noticed that he would have slurred speech that comes and goes a couple of times a day, worse later in the day. He notices he tends to drool on the right side of his mouth sometimes. There is no family history of similar symptoms. He has occasional sinus headaches, no dizziness, diplopia, dysarthria/dysphagia. There was concern for some right sided facial asymmetry, not noticeable in the office today.   Diagnostic Data: I personally reviewed MRI lumbar spine without contrast which did not show any acute changes, there was no evidence of nerve impingement or lower cord myelopathy, there were mild degenerative changes. EMG/NCV of both lower extremities was normal, no evidence of neuropathy or myelopathy. Bloodwork showed normal CK, RF, and B12.   PAST MEDICAL HISTORY: Past Medical History:  Diagnosis Date  . Hyperlipidemia   . Hypertension     MEDICATIONS: Current Outpatient Medications on File Prior to Visit  Medication Sig Dispense Refill  . aspirin EC 81 MG tablet Take 81 mg by mouth daily.    . carbidopa-levodopa (SINEMET IR) 25-100 MG tablet Take 1.5 tablet three times a day before meals 135 tablet 6  . lisinopril (PRINIVIL,ZESTRIL) 10 MG tablet     . Omega-3 Fatty  Acids (FISH OIL) 1000 MG CPDR Take by mouth.    Marland Kitchen omeprazole (PRILOSEC) 40 MG capsule     . pravastatin (PRAVACHOL) 20 MG tablet      No current facility-administered medications on file prior to visit.     ALLERGIES: No Known Allergies  FAMILY HISTORY: Family History  Problem Relation Age of Onset  . Cancer Sister     SOCIAL HISTORY: Social History   Socioeconomic History  . Marital status: Married    Spouse name: Not on file  . Number of children: Not on file  . Years of education: Not on file  . Highest education level:  Not on file  Social Needs  . Financial resource strain: Not on file  . Food insecurity - worry: Not on file  . Food insecurity - inability: Not on file  . Transportation needs - medical: Not on file  . Transportation needs - non-medical: Not on file  Occupational History  . Not on file  Tobacco Use  . Smoking status: Former Games developer  . Smokeless tobacco: Current User  Substance and Sexual Activity  . Alcohol use: Yes  . Drug use: No  . Sexual activity: Not on file  Other Topics Concern  . Not on file  Social History Narrative  . Not on file    REVIEW OF SYSTEMS: Constitutional: No fevers, chills, or sweats, no generalized fatigue, change in appetite Eyes: No visual changes, double vision, eye pain Ear, nose and throat: No hearing loss, ear pain, nasal congestion, sore throat Cardiovascular: No chest pain, palpitations Respiratory:  No shortness of breath at rest or with exertion, wheezes GastrointestinaI: No nausea, vomiting, diarrhea, abdominal pain, fecal incontinence Genitourinary:  No dysuria, urinary retention or frequency Musculoskeletal:  No neck pain, back pain Integumentary: No rash, pruritus, skin lesions Neurological: as above Psychiatric: No depression, insomnia, anxiety Endocrine: No palpitations, fatigue, diaphoresis, mood swings, change in appetite, change in weight, increased thirst Hematologic/Lymphatic:  No anemia, purpura, petechiae. Allergic/Immunologic: no itchy/runny eyes, nasal congestion, recent allergic reactions, rashes  PHYSICAL EXAM: Vitals:   05/12/17 1313  BP: 112/70  Pulse: 80  SpO2: 98%   General: No acute distress Head:  Normocephalic/atraumatic Neck: supple, no paraspinal tenderness, full range of motion Heart:  Regular rate and rhythm Lungs:  Clear to auscultation bilaterally Back: No paraspinal tenderness Skin/Extremities: No rash, no edema Neurological Exam: alert and oriented to person, place, and time, no dysarthria or aphasia,  Fund of knowledge is appropriate.  Recent and remote memory are intact.  Attention and concentration are normal.    Able to name objects and repeat phrases. Cranial nerves: CN I: not tested CN II: pupils equal, round and reactive to light, visual fields intact, fundi unremarkable. CN III, IV, VI:  full range of motion, no nystagmus, no ptosis CN V: facial sensation intact CN VII: upper and lower face symmetric CN VIII: hearing intact to finger rub CN IX, X: gag intact, uvula midline CN XI: sternocleidomastoid and trapezius muscles intact CN XII: tongue midline Bulk & Tone: normal, no cogwheeling, no fasciculations. Motor: 5/5 throughout with no pronator drift. During muscle testing, dystonic right foot inversion noted on prior visits is not seen today Sensation: intact to light touch.  No extinction to double simultaneous stimulation.  Romberg test negative Deep Tendon Reflexes: +1 both UE, brisk +2 bilateral patella, absent ankle jerks, no ankle clonus Plantar responses: downgoing bilaterally Cerebellar: no incoordination on finger to nose testing Gait: slow, small steps ambulating with knees  bent and low clearance of both feet, right foot slightly turns in, unable to tandem walk (similar to prior) Tremor: none No postural instability, regular foot and finger tapping  IMPRESSION: This is a pleasant 72 yo LH man with a history of hypertension, hyperlipidemia, who presented with stiffness and gait difficulties. He has had chronic right foot problems that have caused him to limp, but symptoms have worsened over the past few years, where he notices stiffness in both legs when he first stands up. This improves with activity. His exam previously had shown mild neuropathy, brisk patellar reflexes, possible dystonia of the right foot but not clearly reproducible each time, as well as small steps with low clearance as he ambulates. MRI lumbar spine and EMG/NCV of both legs were unremarkable. CK, RF,  and B12 levels normal. The etiology his symptoms is unclear, consideration for early Parkinson's disease with dystonia and stiffness. He will try increasing Sinemet 25/100mg  to 2 tabs TID. [facility] were given information about doing a DaTscan for further evaluation of symptoms, they will let us know if they would like to proceed. He will follow-up in 4-5 months and knows to call for any changes.   Thank you for allowing me to participate in his care.  Please do not hesitate to call for any questions or concerns.  The duration of this appointment visit was 25 minutes of face-to-face time with the patient.  Greater than 50% of this time was spent in counseling, explanation of diagnosis, planning of further management, and coordination of care.   Patrcia DollyKaren Aquino, M.D.   CC: Dr. Clelia CroftShaw

## 2017-05-12 NOTE — Patient Instructions (Signed)
1. Continue Sinemet 25/100mg : take 1.5 tablets three times a day before meal 2. [facility] 3. Follow-up in 4-5 months, call for any changes

## 2017-06-21 DIAGNOSIS — L570 Actinic keratosis: Secondary | ICD-10-CM | POA: Diagnosis not present

## 2017-06-21 DIAGNOSIS — I129 Hypertensive chronic kidney disease with stage 1 through stage 4 chronic kidney disease, or unspecified chronic kidney disease: Secondary | ICD-10-CM | POA: Diagnosis not present

## 2017-07-14 DIAGNOSIS — Z0000 Encounter for general adult medical examination without abnormal findings: Secondary | ICD-10-CM | POA: Diagnosis not present

## 2017-07-14 DIAGNOSIS — E782 Mixed hyperlipidemia: Secondary | ICD-10-CM | POA: Diagnosis not present

## 2017-07-14 DIAGNOSIS — I129 Hypertensive chronic kidney disease with stage 1 through stage 4 chronic kidney disease, or unspecified chronic kidney disease: Secondary | ICD-10-CM | POA: Diagnosis not present

## 2017-07-14 DIAGNOSIS — K219 Gastro-esophageal reflux disease without esophagitis: Secondary | ICD-10-CM | POA: Diagnosis not present

## 2017-07-14 DIAGNOSIS — G249 Dystonia, unspecified: Secondary | ICD-10-CM | POA: Diagnosis not present

## 2017-07-14 DIAGNOSIS — N183 Chronic kidney disease, stage 3 (moderate): Secondary | ICD-10-CM | POA: Diagnosis not present

## 2017-07-14 DIAGNOSIS — Z125 Encounter for screening for malignant neoplasm of prostate: Secondary | ICD-10-CM | POA: Diagnosis not present

## 2017-09-22 ENCOUNTER — Ambulatory Visit: Payer: Medicare Other | Admitting: Neurology

## 2017-09-22 ENCOUNTER — Other Ambulatory Visit: Payer: Self-pay

## 2017-09-22 ENCOUNTER — Encounter: Payer: Self-pay | Admitting: Neurology

## 2017-09-22 VITALS — BP 102/62 | HR 83 | Ht 68.0 in | Wt 175.0 lb

## 2017-09-22 DIAGNOSIS — R2681 Unsteadiness on feet: Secondary | ICD-10-CM | POA: Diagnosis not present

## 2017-09-22 DIAGNOSIS — G249 Dystonia, unspecified: Secondary | ICD-10-CM

## 2017-09-22 NOTE — Progress Notes (Signed)
NEUROLOGY FOLLOW UP OFFICE NOTE  [name] [id number] 1946-01-02  HISTORY OF PRESENT ILLNESS: I had the pleasure of seeing Joseph Richards in follow-up in the neurology clinic on 05/15/12019.  The patient was last seen 4 months ago for gait difficulties and right foot turning in (?dystonia), and is again accompanied by his wife who helps supplement the history today. He continues to take Sinemet 25/100mg  1.5 tabs TID and has not noticed any significant change in symptoms. He wonders if when increasing dose, his legs have felt heavier. He feels symptoms are worsening, he has more pronounced leg discomfort especially after prolonged sitting. He gets up and both legs feel heavy. He is now using his cane for balance, but it also seems to help with his right leg not being quite as dragging or shuffling. His right foot still turns in, he shows worn out sole on his right shoe. His legs feel heavier later in the day. He has to hold on to furniture more at home. No falls. No numbness/tingling or back pain. He has had some right neck discomfort the past few days. He is noted to be more hoarse today, his wife has noticed a change in his speech with less inflection and enunciation is not as clear. No dysphagia. His wife was noticing drooling previously, that she feels stopped when Sinemet was started. No tremors. No RBD. He has joined the Parkinson's cycling class and feels good after, but by the time he gets to his car, he is dragging his right foot again. His wife has also noticed some cognitive changes, he seems to have more difficulties making inferences when watching TV and has difficulty following conversations at times.  HPI 08/04/2016: This is a pleasant 72 yo LH man with a history of hypertension, hyperlipidemia, who presented with gait difficulties. He and his wife report that he has always had "somewhat of a limp" that becomes more pronounced throughout the day, but over the past 10-12 months, he  started having problems where he would feel stiffness in his legs after he sits for a period of time then gets up to walk. He does not necessarily feel it his his joints, he indicates the stiffness and tightness appears to be in his upper thighs extending down the knee when he gets up. He would be awkward when he first gets up, but gets better as the day goes on. He denies any numbness or tingling, no back pain or neck pain. His wife reports that he does have back pain and feels he will need a walker within the year. They deny any falls. No bowel/bladder dysfunction. They have been married more than 38 years and she has always noticed he drags the right foot a little more. He states right foot difficulties were from injury from a pitchfork when he was a child. He has always had difficulties going down stairs. He has a poor sense of balance but is gotten worse. Recently she also noticed that he would have slurred speech that comes and goes a couple of times a day, worse later in the day. He notices he tends to drool on the right side of his mouth sometimes. There is no family history of similar symptoms. He has occasional sinus headaches, no dizziness, diplopia, dysarthria/dysphagia. There was concern for some right sided facial asymmetry, not noticeable in the office today.   Diagnostic Data: I personally reviewed MRI brain with and without contrast done 06/2016 which did not show any acute changes.  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 MRI lumbar spine without contrast which did not show any acute changes, there was no evidence of nerve impingement or lower cord myelopathy, there were mild degenerative changes. EMG/NCV of both lower extremities was normal, no evidence of neuropathy or myelopathy. Bloodwork showed normal CK, RF, and B12.   PAST MEDICAL HISTORY: Past Medical History:  Diagnosis Date  .  Hyperlipidemia   . Hypertension     MEDICATIONS: Current Outpatient Medications on File Prior to Visit  Medication Sig Dispense Refill  . aspirin EC 81 MG tablet Take 81 mg by mouth daily.    . carbidopa-levodopa (SINEMET IR) 25-100 MG tablet Take 1.5 tablet three times a day before meals 135 tablet 6  . lisinopril (PRINIVIL,ZESTRIL) 10 MG tablet     . Omega-3 Fatty Acids (FISH OIL) 1000 MG CPDR Take by mouth.    Marland Kitchen omeprazole (PRILOSEC) 40 MG capsule     . pravastatin (PRAVACHOL) 20 MG tablet      No current facility-administered medications on file prior to visit.     ALLERGIES: No Known Allergies  FAMILY HISTORY: Family History  Problem Relation Age of Onset  . Cancer Sister     SOCIAL HISTORY: Social History   Socioeconomic History  . Marital status: Married    Spouse name: Not on file  . Number of children: Not on file  . Years of education: Not on file  . Highest education level: Not on file  Occupational History  . Not on file  Social Needs  . Financial resource strain: Not on file  . Food insecurity:    Worry: Not on file    Inability: Not on file  . Transportation needs:    Medical: Not on file    Non-medical: Not on file  Tobacco Use  . Smoking status: Former Games developer  . Smokeless tobacco: Current User  Substance and Sexual Activity  . Alcohol use: Yes  . Drug use: No  . Sexual activity: Not on file  Lifestyle  . Physical activity:    Days per week: Not on file    Minutes per session: Not on file  . Stress: Not on file  Relationships  . Social connections:    Talks on phone: Not on file    Gets together: Not on file    Attends religious service: Not on file    Active member of club or organization: Not on file    Attends meetings of clubs or organizations: Not on file    Relationship status: Not on file  . Intimate partner violence:    Fear of current or ex partner: Not on file    Emotionally abused: Not on file    Physically abused: Not on  file    Forced sexual activity: Not on file  Other Topics Concern  . Not on file  Social History Narrative  . Not on file    REVIEW OF SYSTEMS: Constitutional: No fevers, chills, or sweats, no generalized fatigue, change in appetite Eyes: No visual changes, double vision, eye pain Ear, nose and throat: No hearing loss, ear pain, nasal congestion, sore throat Cardiovascular: No chest pain, palpitations Respiratory:  No shortness of breath at rest or with exertion, wheezes GastrointestinaI: No nausea, vomiting, diarrhea, abdominal pain, fecal incontinence Genitourinary:  No dysuria, urinary retention or frequency Musculoskeletal:  + mild neck pain,no back pain Integumentary: No rash, pruritus, skin  lesions Neurological: as above Psychiatric: No depression, insomnia, anxiety Endocrine: No palpitations, fatigue, diaphoresis, mood swings, change in appetite, change in weight, increased thirst Hematologic/Lymphatic:  No anemia, purpura, petechiae. Allergic/Immunologic: no itchy/runny eyes, nasal congestion, recent allergic reactions, rashes  PHYSICAL EXAM: Vitals:   09/22/17 1306  BP: 102/62  Pulse: 83  SpO2: 98%   General: No acute distress Head:  Normocephalic/atraumatic Neck: supple, no paraspinal tenderness, full range of motion Heart:  Regular rate and rhythm Lungs:  Clear to auscultation bilaterally Back: No paraspinal tenderness Skin/Extremities: No rash, no edema Neurological Exam: alert and oriented to person, place, and time, no dysarthria or aphasia but note of decreased tone of voice/hoarse, Fund of knowledge is appropriate.  Recent and remote memory are intact.  Attention and concentration are normal.    Able to name objects and repeat phrases. Cranial nerves: CN I: not tested CN II: pupils equal, round and reactive to light, visual fields intact CN III, IV, VI:  full range of motion, no nystagmus, no ptosis CN V: facial sensation intact CN VII: upper and lower face  symmetric CN VIII: hearing intact to finger rub CN IX, X: gag intact, uvula midline CN XI: sternocleidomastoid and trapezius muscles intact CN XII: tongue midline Bulk & Tone: normal, no cogwheeling, no fasciculations. Motor: 5/5 throughout with no pronator drift. During muscle testing, dystonic right foot inversion noted on prior visits is not seen today Sensation: intact to all modalities on both UE, intact pin on both LE, decreased cold and vibration sense to ankles bilaterally.  No extinction to double simultaneous stimulation.  Romberg test negative Deep Tendon Reflexes: +1 both UE, brisk +2 bilateral patella, absent ankle jerks, no ankle clonus Plantar responses: downgoing bilaterally Cerebellar: no incoordination on finger to nose testing Gait: slow, small steps ambulating with knees bent and low clearance of both feet, right foot turned in, dragging right foot, unable to tandem walk (similar to prior) Tremor: none +pull test, good finger and foot taps  IMPRESSION: This is a pleasant 72 yo LH man with a history of hypertension, hyperlipidemia, who presented with stiffness and gait difficulties. He has had chronic right foot problems that have caused him to limp, but symptoms have worsened over the past few years, to the point where his right foot drags and turns in on ambulation. He also notices leg stiffness and heaviness. His exam continues to show mild neuropathy, brisk patellar reflexes, possible dystonia of the right foot but not clearly reproducible each time, as well as small steps with low clearance as he ambulates, dragging right foot. MRI brain unremarkable, MRI lumbar spine and EMG/NCV of both legs were unremarkable. CK, RF, and B12 levels normal. The etiology his symptoms is unclear, consideration for early Parkinson's disease with dystonia and stiffness was again discussed with them today. He does not have all the features of Parkinson's disease and has not noticed much change  with Sinemet. He will start tapering off Sinemet. He will be referred to our Movement Disorders specialist Dr. Arbutus Leas for a second opinion. Continue regular exercise. He will follow-up after consult with Dr. Arbutus Leas and knows to call for any changes.   Thank you for allowing me to participate in his care.  Please do not hesitate to call for any questions or concerns.  The duration of this appointment visit was 30 minutes of face-to-face time with the patient.  Greater than 50% of this time was spent in counseling, explanation of diagnosis, planning of further management, and coordination  of care.   Patrcia Dolly, M.D.   CC: Dr. Clelia Croft

## 2017-09-22 NOTE — Patient Instructions (Signed)
1. Start weaning down Sinemet to 1 tab three times a day for a week, then stop. See if you notice any difference 2. Refer to Dr. Arbutus Leas for second opinion 3. Follow-up after visit with Dr. Arbutus Leas, call for any changes

## 2017-09-24 NOTE — Progress Notes (Signed)
Joseph Richards was seen today in the movement disorders clinic for neurologic consultation at the request of Karel Jarvis Lesle Chris, MD.  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  The records that were made available to me were reviewed.  First saw Dr. Karel Jarvis in 07/2016 at which point in time the patient had been noting limping and a stiffness in the leg, which have been going on for about 1 year prior to presentation.  It is worse when he has not moved and better after activity.  Levodopa was initiated in April, 2018.  The patients wife thought that carbidopa/levodopa was helpful for drooling but not helpful for sx's in the R foot.  He was ultimately worked up to carbidopa/levodopa 25/100, 1.5 tablets 3 times per day.   He is off of medication now for the last 2 days.  This patient is accompanied in the office by his spouse who supplements the history.  Reports that he has "always" been sluggish in that R foot since he was 43-52 years old, and that foot has always tended to turn inward but it became more pronounced over the last few years.  He never played sports but not necessarily because of the leg.  Wife states that the slowness and unsteadiness of gait over the last few years have become more pronounced.  Pain is not a feature with the turning in.  In the AM, he has a heaviness in both legs.  The same is true in the late afternoon.     Specific Symptoms:  Tremor: No. Family hx of similar:  No. Voice: some weaker - more effort to get it out.  Wife read "parkinsons for dummies" and reports that she noted tapering of the voice Sleep: trouble staying asleep - has nocturia x 1 and then can't get back to sleep  Vivid Dreams:  No.  Acting out dreams:  Yes.  , some sleep talking Wet Pillows: Yes.   Postural symptoms:  Yes.    Falls?  No. Bradykinesia symptoms: difficulty getting out of a chair (if sits for long time); daytime drooling; drags the right foot more than the left Loss of  smell:  Yes.   Loss of taste:  No. Urinary Incontinence:  No.  Difficulty Swallowing:  No. Handwriting, micrographia: No. Trouble with ADL's:  No. (has always sat to put on pants, even when young)  Trouble buttoning clothing: No. Depression:  Perhaps; bigger issue is irritability per wife Memory changes:  Says its "average" Hallucinations:  No.  visual distortions: Yes.   N/V:  No. Lightheaded:  No.  Syncope: No. Diplopia:  No., but has floaters and blurry vision.  Told by OD that may be due to cataract Dyskinesia:  No.  Neuroimaging of the brain has previously been performed.  It is available for my review today.  there was mild small vessel disease on MRI brain dated 07/06/16.  He had MRI lumbar spin on 08/15/16.  I reviewed this.  There was mild disc protrusion but otherwise neg.  PREVIOUS MEDICATIONS: Sinemet  ALLERGIES:  No Known Allergies  CURRENT MEDICATIONS:  Outpatient Encounter Medications as of 09/28/2017  Medication Sig  . aspirin EC 81 MG tablet Take 81 mg by mouth daily.  . carbidopa-levodopa (SINEMET IR) 25-100 MG tablet Take 1.5 tablet three times a day before meals  . lisinopril (PRINIVIL,ZESTRIL) 2.5 MG tablet Take 2.5 mg by mouth daily.  . Omega-3 Fatty Acids (FISH OIL) 1000 MG CPDR Take  by mouth.  Marland Kitchen omeprazole (PRILOSEC) 40 MG capsule   . pravastatin (PRAVACHOL) 20 MG tablet    No facility-administered encounter medications on file as of 09/28/2017.     PAST MEDICAL HISTORY:   Past Medical History:  Diagnosis Date  . Hyperlipidemia   . Hypertension     PAST SURGICAL HISTORY:  History reviewed. No pertinent surgical history.  SOCIAL HISTORY:   Social History   Socioeconomic History  . Marital status: Married    Spouse name: Not on file  . Number of children: Not on file  . Years of education: Not on file  . Highest education level: Not on file  Occupational History  . Not on file  Social Needs  . Financial resource strain: Not on file  . Food  insecurity:    Worry: Not on file    Inability: Not on file  . Transportation needs:    Medical: Not on file    Non-medical: Not on file  Tobacco Use  . Smoking status: Former Games developer  . Smokeless tobacco: Current User  Substance and Sexual Activity  . Alcohol use: Yes  . Drug use: No  . Sexual activity: Not on file  Lifestyle  . Physical activity:    Days per week: Not on file    Minutes per session: Not on file  . Stress: Not on file  Relationships  . Social connections:    Talks on phone: Not on file    Gets together: Not on file    Attends religious service: Not on file    Active member of club or organization: Not on file    Attends meetings of clubs or organizations: Not on file    Relationship status: Not on file  . Intimate partner violence:    Fear of current or ex partner: Not on file    Emotionally abused: Not on file    Physically abused: Not on file    Forced sexual activity: Not on file  Other Topics Concern  . Not on file  Social History Narrative  . Not on file    FAMILY HISTORY:   Family Status  Relation Name Status  . Mother  Deceased  . Father  Deceased  . Sister  Alive    ROS:  Minimal neck pain.  Admits to some back pain.  A complete 10 system review of systems was obtained and was unremarkable apart from what is mentioned above.  PHYSICAL EXAMINATION:    VITALS:  There were no vitals filed for this visit.  GEN:  The patient appears stated age and is in NAD. HEENT:  Normocephalic, atraumatic.  The mucous membranes are moist. The superficial temporal arteries are without ropiness or tenderness. CV:  RRR Lungs:  CTAB Neck/HEME:  There are no carotid bruits bilaterally.  Neurological examination:  Orientation: The patient is alert and oriented x3. Fund of knowledge is appropriate.  Recent and remote memory are intact.  Attention and concentration are normal.    Able to name objects and repeat phrases. Cranial nerves: There is good facial  symmetry. Pupils are equal round and reactive to light bilaterally. Fundoscopic exam reveals clear margins bilaterally. Extraocular muscles are intact. The visual fields are full to confrontational testing. The speech is fluent and clear. No significant hypophonia.  Soft palate rises symmetrically and there is no tongue deviation. Hearing is intact to conversational tone. Sensation: Sensation is intact to light and pinprick throughout (facial, trunk, extremities). Vibration is intact at the  bilateral big toe. There is no extinction with double simultaneous stimulation. There is no sensory dermatomal level identified. Motor: Strength is 5/5 in the bilateral upper and lower extremities.   Shoulder shrug is equal and symmetric.  There is no pronator drift. Deep tendon reflexes: Deep tendon reflexes are 2/4 at the bilateral biceps, triceps, brachioradialis, 3-3+ at the bilateral patella (with cross adductor reflexes bilaterally) and 1/4 at the bilateral achilles. Plantar responses are downgoing bilaterally.  Movement examination: Tone: There is normal tone in the bilateral upper extremities.  No rigidity in the LE but mild spasticity.  Abnormal movements: none even with distraction Coordination:  There is no decremation with RAM's, with  alternating supination and pronation of the forearm, hand opening and closing, finger taps bilaterally.  He is slow with heel and toe taps bilaterally. Gait and Station: The patient has mild difficulty arising out of a deep-seated chair without the use of the hands.  He is flexed at the waist and walks on the toes.  The R foot is turned inward with ambulation (at rest in the chair both feet are inward).   The patient is unsteady  ASSESSMENT/PLAN:  1.  Gait instability  -I do not see evidence of PD or even an atypical state, although this is a possibility.  His history is very confusing, as he reports what sounds like dystonia of the right foot ever since the age of 72 years  old.  It sounds like over the last few years, this has been worsening with more associated gait instability.  I would recommend looking at a SCA panel, as many of these are associated with dystonia, esp SCA 3.    -pt is very hyperreflexic in the legs.  MRI lumbar spine was unremarkable.  May need to consider MRI T-spine and C-spine if dystonia panel not helpful.  -may need to consider pushing up L-dopa to see if he may have DRD.  -should use ambulatory assistive device at all times  2.  Pt will be f/u with Dr. Karel Jarvis.  Discussed above with her.  Much greater than 50% of this visit was spent in counseling and coordinating care.  Total face to face time:  45 min  Cc:  Lupita Raider, MD

## 2017-09-28 ENCOUNTER — Encounter: Payer: Self-pay | Admitting: Neurology

## 2017-09-28 ENCOUNTER — Ambulatory Visit: Payer: Medicare Other | Admitting: Neurology

## 2017-09-28 VITALS — BP 152/74 | HR 66 | Ht 68.0 in | Wt 172.0 lb

## 2017-09-28 DIAGNOSIS — R270 Ataxia, unspecified: Secondary | ICD-10-CM | POA: Diagnosis not present

## 2017-09-28 DIAGNOSIS — G249 Dystonia, unspecified: Secondary | ICD-10-CM

## 2017-09-30 ENCOUNTER — Telehealth: Payer: Self-pay | Admitting: Neurology

## 2017-09-30 NOTE — Telephone Encounter (Signed)
[facility] received referral for patient and will reach out to them directly.

## 2017-10-08 ENCOUNTER — Telehealth: Payer: Self-pay | Admitting: Neurology

## 2017-10-08 NOTE — Telephone Encounter (Signed)
Pt called and said Joseph Richards needs to know the name of the blood test ordered for a form [facility] needs for pt's paperwork

## 2017-10-08 NOTE — Telephone Encounter (Signed)
Patient given names of [facility] testing ordered.

## 2017-11-12 ENCOUNTER — Telehealth: Payer: Self-pay | Admitting: Neurology

## 2017-11-12 DIAGNOSIS — R292 Abnormal reflex: Secondary | ICD-10-CM

## 2017-11-12 NOTE — Telephone Encounter (Signed)
Left message on machine for patient to call back.

## 2017-11-12 NOTE — Telephone Encounter (Signed)
Let pt know that genetic testing for his symptoms (SCA) was negative.  Make sure that pt has a f/u with Dr. Karel JarvisAquino.

## 2017-11-12 NOTE — Telephone Encounter (Signed)
Patient made aware of results and need for MRIs. Orders entered. [facility]   Meagen- can you make sure patient gets a follow up appt with Dr. Karel JarvisAquino.

## 2017-11-12 NOTE — Telephone Encounter (Signed)
Thanks Dr. Arbutus Leasat and Lesly RubensteinJade. Jade, when you call pt about bloodwork, pls let him know Dr. Arbutus Leasat mentioned that if bloodwork was normal, would do an MRI cervical and thoracic spine without contrast. Pls order, Dx: hyperreflexia. Thanks

## 2017-11-12 NOTE — Telephone Encounter (Signed)
It was also negative for DYT1 dystonia

## 2017-11-30 ENCOUNTER — Ambulatory Visit
Admission: RE | Admit: 2017-11-30 | Discharge: 2017-11-30 | Disposition: A | Discharge status: Home / Self Care | Payer: Medicare Other | Source: Ambulatory Visit | Attending: Neurology | Admitting: Neurology

## 2017-11-30 ENCOUNTER — Other Ambulatory Visit: Payer: Medicare Other

## 2017-11-30 DIAGNOSIS — R292 Abnormal reflex: Secondary | ICD-10-CM

## 2017-12-14 ENCOUNTER — Ambulatory Visit: Payer: Medicare Other | Admitting: Neurology

## 2017-12-16 ENCOUNTER — Encounter: Payer: Self-pay | Admitting: Neurology

## 2017-12-16 ENCOUNTER — Ambulatory Visit: Payer: Medicare Other | Admitting: Neurology

## 2017-12-16 ENCOUNTER — Other Ambulatory Visit: Payer: Self-pay

## 2017-12-16 VITALS — BP 120/82 | HR 73 | Ht 68.5 in | Wt 173.0 lb

## 2017-12-16 DIAGNOSIS — R29898 Other symptoms and signs involving the musculoskeletal system: Secondary | ICD-10-CM

## 2017-12-16 DIAGNOSIS — G249 Dystonia, unspecified: Secondary | ICD-10-CM

## 2017-12-16 DIAGNOSIS — R2681 Unsteadiness on feet: Secondary | ICD-10-CM | POA: Diagnosis not present

## 2017-12-16 MED ORDER — CARBIDOPA-LEVODOPA 25-100 MG PO TABS: ORAL_TABLET | ORAL | 6 refills | Status: DC

## 2017-12-16 NOTE — Progress Notes (Signed)
NEUROLOGY FOLLOW UP OFFICE NOTE  MATTHE Richards [id number] 20-Jul-1945  HISTORY OF PRESENT ILLNESS: I had the pleasure of seeing Joseph Richards in follow-up in the neurology clinic on 12/24/2017.  The patient was last seen 3 months ago for gait difficulties and right foot turning in (?dystonia), and is again accompanied by his wife who helps supplement the history today. He has had an unremarkable MRI brain with and without contrast 06/2016, normal EMG/NCV of both legs done 08/2016, unremarkable MRI lumbar spine with mild degenerative changes, no impingement or myelopathy seen. He did physical therapy and a trial of Sinemet for possible Parkinson's disease, but did not notice any improvement in symptoms. He had seen Movement Disorders specialist Dr. Arbutus Leas for a second opinion on possible Parkinson's disease, with a diagnosis of gait instability, no evidence of PD or even an atypical state, although this is a possibility. 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 She recommended doing an MRI cervical and thoracic spine if dystonia panel was negative, I personally reviewed MRI done 11/2017 which did not show any myelopathy, there was degenerative change with severe left C4 foraminal stenosis and mild left C5 foraminal stenosis, normal thoracic spine. He has stopped the Sinemet and presents today reporting he now uses a cane for balance. His right foot continues to turn in and his legs feel heavy, particularly his thigh muscles. When he bends down, he feels like his thigh muscles are inflamed and burning. His feet feel heavy. He has always had a fear of stairs, but now he avoids them more. No upper extremity symptoms. He denies any numbness/tingling except when driving for prolonged periods with his wallet pushing on his back pocket. He denies any falls. His wife notices his speech seems  slurred later in the day, more noticeable at home, and that when he is around other people speech is clear. No dysphagia.   HPI 08/04/2016: This is a pleasant 71 yo LH man with a history of hypertension, hyperlipidemia, who presented with gait difficulties. He and his wife report that he has always had "somewhat of a limp" that becomes more pronounced throughout the day, but over the past 10-12 months, he started having problems where he would feel stiffness in his legs after he sits for a period of time then gets up to walk. He does not necessarily feel it his his joints, he indicates the stiffness and tightness appears to be in his upper thighs extending down the knee when he gets up. He would be awkward when he first gets up, but gets better as the day goes on. He denies any numbness or tingling, no back pain or neck pain. His wife reports that he does have back pain and feels he will need a walker within the year. They deny any falls. No bowel/bladder dysfunction. They have been married more than 38 years and she has always noticed he drags the right foot a little more. He states right foot difficulties were from injury from a pitchfork when he was a child. He has always had difficulties going down stairs. He has a poor sense of balance but is gotten worse. Recently she also noticed that he would have slurred speech that comes and goes a couple of times a day, worse later in the day. He notices he tends to drool on the right side of his mouth sometimes. There  is no family history of similar symptoms. He has occasional sinus headaches, no dizziness, diplopia, dysarthria/dysphagia. There was concern for some right sided facial asymmetry, not noticeable in the office today.   Diagnostic Data: I personally reviewed MRI brain with and without contrast done 06/2016 which did not show any acute changes. 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 MRI lumbar spine without contrast which did not show any acute changes, there was no evidence of nerve impingement or lower cord myelopathy, there were mild degenerative changes. EMG/NCV of both lower extremities was normal, no evidence of neuropathy or myelopathy. Bloodwork showed normal CK, RF, and B12.   PAST MEDICAL HISTORY: Past Medical History:  Diagnosis Date  . GERD (gastroesophageal reflux disease)   . Hyperlipidemia   . Hypertension     MEDICATIONS: Current Outpatient Medications on File Prior to Visit  Medication Sig Dispense Refill  . aspirin EC 81 MG tablet Take 81 mg by mouth daily.    . carbidopa-levodopa (SINEMET IR) 25-100 MG tablet Take 1.5 tablet three times a day before meals (Patient not taking: Reported on 09/28/2017) 135 tablet 6  . lisinopril (PRINIVIL,ZESTRIL) 2.5 MG tablet Take 2.5 mg by mouth daily.  11  . MULTIPLE VITAMIN PO Take by mouth.    . Omega-3 Fatty Acids (FISH OIL) 1000 MG CPDR Take by mouth.    Marland Kitchen. omeprazole (PRILOSEC) 40 MG capsule     . pravastatin (PRAVACHOL) 20 MG tablet      No current facility-administered medications on file prior to visit.     ALLERGIES: No Known Allergies  FAMILY HISTORY: Family History  Problem Relation Age of Onset  . Aneurysm Mother        brain  . Lung cancer Father   . Heart attack Father   . Breast cancer Sister   . Healthy Daughter     SOCIAL HISTORY: Social History   Socioeconomic History  . Marital status: Married    Spouse name: Not on file  . Number of children: Not on file  . Years of education: Not on file  . Highest education level: Not on file  Occupational History  . 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 Financial resource strain: Not on file  . Food insecurity:    Worry: Not on file    Inability: Not on file  . Transportation needs:    Medical: Not on file    Non-medical: Not on file  Tobacco Use  . Smoking status:  Former Smoker    Last attempt to quit: 09/29/1982    Years since quitting: 35.2  . Smokeless tobacco: Current User    Types: Chew  Substance and Sexual Activity  . Alcohol use: Yes    Comment: 1 drink every couple months  . Drug use: No  . Sexual activity: Not on file  Lifestyle  . Physical activity:    Days per week: Not on file    Minutes per session: Not on file  . Stress: Not on file  Relationships  . Social connections:    Talks on phone: Not on file    Gets together: Not on file    Attends religious service: Not on file    Active member of club or organization: Not on file    Attends meetings of clubs or organizations: Not on file    Relationship status: Not on file  .  Intimate partner violence:    Fear of current or ex partner: Not on file    Emotionally abused: Not on file    Physically abused: Not on file    Forced sexual activity: Not on file  Other Topics Concern  . Not on file  Social History Narrative  . Not on file    REVIEW OF SYSTEMS: Constitutional: No fevers, chills, or sweats, no generalized fatigue, change in appetite Eyes: No visual changes, double vision, eye pain Ear, nose and throat: No hearing loss, ear pain, nasal congestion, sore throat Cardiovascular: No chest pain, palpitations Respiratory:  No shortness of breath at rest or with exertion, wheezes GastrointestinaI: No nausea, vomiting, diarrhea, abdominal pain, fecal incontinence Genitourinary:  No dysuria, urinary retention or frequency Musculoskeletal:  + mild neck pain,no back pain Integumentary: No rash, pruritus, skin lesions Neurological: as above Psychiatric: No depression, insomnia, anxiety Endocrine: No palpitations, fatigue, diaphoresis, mood swings, change in appetite, change in weight, increased thirst Hematologic/Lymphatic:  No anemia, purpura, petechiae. Allergic/Immunologic: no itchy/runny eyes, nasal congestion, recent allergic reactions, rashes  PHYSICAL EXAM: Vitals:    12/16/17 1155  BP: 120/82  Pulse: 73  SpO2: 97%   General: No acute distress Head:  Normocephalic/atraumatic Neck: supple, no paraspinal tenderness, full range of motion Heart:  Regular rate and rhythm Lungs:  Clear to auscultation bilaterally Back: No paraspinal tenderness Skin/Extremities: No rash, no edema Neurological Exam: alert and oriented to person, place, and time, no dysarthria or aphasia but note of decreased tone of voice/hoarse (similar to prior), Fund of knowledge is appropriate.  Recent and remote memory are intact.  Attention and concentration are normal.    Able to name objects and repeat phrases. Cranial nerves: CN I: not tested CN II: pupils equal, round and reactive to light, visual fields intact CN III, IV, VI:  full range of motion, no nystagmus, no ptosis CN V: facial sensation intact CN VII: upper and lower face symmetric CN VIII: hearing intact to finger rub CN IX, X: gag intact, uvula midline CN XI: sternocleidomastoid and trapezius muscles intact CN XII: tongue midline Bulk & Tone: normal, no cogwheeling, no fasciculations. Motor: 5/5 throughout with no pronator drift. During muscle testing, dystonic right foot inversion noted on prior visits is not seen today Sensation: intact to all modalities on both UE, intact pin on both LE, decreased cold and vibration sense to ankles bilaterally (similar to prior).  No extinction to double simultaneous stimulation.  Romberg test negative Deep Tendon Reflexes: +1 both UE, brisk +2 bilateral patella, absent ankle jerks, no ankle clonus Plantar responses: downgoing bilaterally Cerebellar: no incoordination on finger to nose testing Gait: slightly hunched posture with slow, small steps ambulating with knees bent and low clearance of both feet, right foot more turned in, difficulty with turning, unable to tandem walk (similar to prior) Tremor: none  IMPRESSION: This is a pleasant 72 yo LH man with a history of  hypertension, hyperlipidemia, who presented with stiffness and gait difficulties. He has had chronic right foot problems that have caused him to limp, but symptoms have worsened over the past few years, to the point where his right foot drags and turns in on ambulation. He ambulates with knees bent. He also reports leg stiffness and heaviness. Etiology of symptoms unclear, he has had extensive testing including MRI brain, cervical/thoracic/lumbar spine, and EMG/NCV a year ago which were unremarkable. He has had an SCA panel for dystonia which is negative. He obtained a second opinion from our  Movement Disorders specialist and symptoms are not felt to be due to Parkinson's disease, however a trial of higher dose Sinemet was suggested. He will try increasing dose to 25/100mg  2 tabs TID. The patient and his wife are interested in seeking a second opinion, he will be referred to PMR specialist Dr. Wynn Banker for evaluation and treatment, ?would Botox be helpful. He will follow-up in 4-5 months and knows to call for any changes.   Thank you for allowing me to participate in his care.  Please do not hesitate to call for any questions or concerns.  The duration of this appointment visit was 30 minutes of face-to-face time with the patient.  Greater than 50% of this time was spent in counseling, explanation of diagnosis, planning of further management, and coordination of care.   Joseph Richards, M.D.   CC: Dr. Clelia Croft

## 2017-12-16 NOTE — Patient Instructions (Addendum)
1. Repeat EMG/NCV of both LE with Dr. Allena KatzPatel 2. Try restarting Sinemet 25/100mg : take 1 tablet three times a day with meals for a week, then increase to 2 tablets three times a day with meals 3. Refer to Dr. Wynn BankerKirsteins with [facility] for evaluation and treatment 4. Follow-up after visit with Dr. Wynn BankerKirsteins

## 2017-12-24 ENCOUNTER — Encounter: Payer: Self-pay | Admitting: Neurology

## 2018-01-04 ENCOUNTER — Ambulatory Visit (INDEPENDENT_AMBULATORY_CARE_PROVIDER_SITE_OTHER): Payer: Medicare Other | Admitting: Neurology

## 2018-01-04 DIAGNOSIS — G249 Dystonia, unspecified: Secondary | ICD-10-CM | POA: Diagnosis not present

## 2018-01-04 DIAGNOSIS — R2681 Unsteadiness on feet: Secondary | ICD-10-CM

## 2018-01-04 DIAGNOSIS — R29898 Other symptoms and signs involving the musculoskeletal system: Secondary | ICD-10-CM

## 2018-01-04 NOTE — Procedures (Signed)
[facility]  [address], [address]  [place], [place] [address] Tel: [phone] Fax:  [phone] Test Date:  01/04/2018  Patient: Joseph Richards DOB: Feb 01, 1946 Physician: Nita Sickleonika Patel, DO  Sex: Male Height: 5\' 8"  Ref Phys: Patrcia DollyKaren Aquino, M.D.  ID#: [id number] Temp: 34.6C Technician:    Patient Complaints: This is a 72 year-old man referred for evaluation of gait difficulty and leg weakness.  NCV & EMG Findings: Extensive electrodiagnostic testing of the right lower extremity and additional studies of the left shows:  1. Bilateral sural and superficial peroneal sensory responses are within normal limits. 2. Bilateral peroneal and tibial motor responses are within normal limits. 3. Bilateral tibial H reflex studies are within normal limits. 4. There is no evidence of active or chronic motor axon loss changes affecting any of the tested muscles. Motor unit configuration and recruitment pattern is within normal limits.  Impression: This is a normal study of the lower extremities, unchanged when compared to 08/18/2016. In particular, there is no evidence of a sensorimotor polyneuropathy or lumbosacral radiculopathy.   ___________________________ Nita Sickleonika Patel, DO   Nerve Conduction Studies Anti Sensory Summary Table   Site NR Peak (ms) Norm Peak (ms) P-T Amp (V) Norm P-T Amp  Left Sup Peroneal Anti Sensory (Ant Lat Mall)  34.6C  12 cm    1.9 <4.6 9.3 >3  Right Sup Peroneal Anti Sensory (Ant Lat Mall)  34.6C  12 cm    2.3 <4.6 11.5 >3  Left Sural Anti Sensory (Lat Mall)  34.6C  Calf    3.3 <4.6 8.7 >3  Right Sural Anti Sensory (Lat Mall)  34.6C  Calf    3.2 <4.6 8.3 >3   Motor Summary Table   Site NR Onset (ms) Norm Onset (ms) O-P Amp (mV) Norm O-P Amp Site1 Site2 Delta-0 (ms) Dist (cm) Vel (m/s) Norm Vel (m/s)  Left Peroneal Motor (Ext Dig Brev)  34.6C  Ankle    4.7 <6.0 2.6 >2.5 B Fib Ankle 7.3 36.0 49 >40  B Fib    12.0  2.1  Poplt B Fib 1.8 9.0 50  >40  Poplt    13.8  2.1         Right Peroneal Motor (Ext Dig Brev)  34.6C  Ankle    2.5 <6.0 4.3 >2.5 B Fib Ankle 8.1 37.0 46 >40  B Fib    10.6  3.9  Poplt B Fib 1.4 8.0 57 >40  Poplt    12.0  3.6         Left Tibial Motor (Abd Hall Brev)  34.6C  Ankle    3.8 <6.0 4.2 >4 Knee Ankle 9.2 42.0 46 >40  Knee    13.0  2.4         Right Tibial Motor (Abd Hall Brev)  34.6C  Ankle    4.1 <6.0 7.5 >4 Knee Ankle 8.1 42.0 52 >40  Knee    12.2  5.9          H Reflex Studies   NR H-Lat (ms) Lat Norm (ms) L-R H-Lat (ms)  Left Tibial (Gastroc)  34.6C     34.69 <35 0.00  Right Tibial (Gastroc)  34.6C     34.69 <35 0.00   EMG   Side Muscle Ins Act Fibs Psw Fasc Number Recrt Dur Dur. Amp Amp. Poly Poly. Comment  Right Gastroc Nml Nml Nml Nml Nml Nml Nml Nml Nml Nml Nml Nml N/A  Right AntTibialis Nml Nml Nml  Nml Nml Nml Nml Nml Nml Nml Nml Nml N/A  Right Flex Dig Long Nml Nml Nml Nml Nml Nml Nml Nml Nml Nml Nml Nml N/A  Right RectFemoris Nml Nml Nml Nml Nml Nml Nml Nml Nml Nml Nml Nml N/A  Right GluteusMed Nml Nml Nml Nml Nml Nml Nml Nml Nml Nml Nml Nml N/A  Left AntTibialis Nml Nml Nml Nml Nml Nml Nml Nml Nml Nml Nml Nml N/A  Left Gastroc Nml Nml Nml Nml Nml Nml Nml Nml Nml Nml Nml Nml N/A  Left Flex Dig Long Nml Nml Nml Nml Nml Nml Nml Nml Nml Nml Nml Nml N/A  Left RectFemoris Nml Nml Nml Nml Nml Nml Nml Nml Nml Nml Nml Nml N/A  Left GluteusMed Nml Nml Nml Nml Nml Nml Nml Nml Nml Nml Nml Nml N/A      Waveforms:

## 2018-01-27 ENCOUNTER — Telehealth: Payer: Self-pay

## 2018-01-27 NOTE — Telephone Encounter (Signed)
Spoke with pt relaying message below.  He states that his appointment with Dr. Wynn BankerKirsteins is scheduled for 02/01/18

## 2018-01-27 NOTE — Telephone Encounter (Signed)
-----   Message from Van ClinesKaren M Aquino, MD sent at 01/27/2018 12:48 PM EDT ----- Pls let patient and wife know the nerve and muscle test was normal, proceed with evaluation with Dr. Wynn BankerKirsteins. Thanks

## 2018-02-01 ENCOUNTER — Ambulatory Visit: Payer: Medicare Other | Admitting: Physical Medicine & Rehabilitation

## 2018-02-01 ENCOUNTER — Encounter: Payer: Self-pay | Admitting: Physical Medicine & Rehabilitation

## 2018-02-01 ENCOUNTER — Encounter: Payer: Medicare Other | Attending: Physical Medicine & Rehabilitation

## 2018-02-01 VITALS — BP 132/89 | HR 70 | Ht 68.5 in | Wt 171.6 lb

## 2018-02-01 DIAGNOSIS — Z801 Family history of malignant neoplasm of trachea, bronchus and lung: Secondary | ICD-10-CM | POA: Diagnosis not present

## 2018-02-01 DIAGNOSIS — Z803 Family history of malignant neoplasm of breast: Secondary | ICD-10-CM | POA: Diagnosis not present

## 2018-02-01 DIAGNOSIS — Z9889 Other specified postprocedural states: Secondary | ICD-10-CM | POA: Insufficient documentation

## 2018-02-01 DIAGNOSIS — G248 Other dystonia: Secondary | ICD-10-CM | POA: Insufficient documentation

## 2018-02-01 DIAGNOSIS — Z87891 Personal history of nicotine dependence: Secondary | ICD-10-CM | POA: Insufficient documentation

## 2018-02-01 DIAGNOSIS — I10 Essential (primary) hypertension: Secondary | ICD-10-CM | POA: Diagnosis not present

## 2018-02-01 DIAGNOSIS — K219 Gastro-esophageal reflux disease without esophagitis: Secondary | ICD-10-CM | POA: Diagnosis not present

## 2018-02-01 DIAGNOSIS — E785 Hyperlipidemia, unspecified: Secondary | ICD-10-CM | POA: Diagnosis not present

## 2018-02-01 DIAGNOSIS — M21072 Valgus deformity, not elsewhere classified, left ankle: Secondary | ICD-10-CM | POA: Diagnosis not present

## 2018-02-01 NOTE — Progress Notes (Signed)
Subjective:    Patient ID: Joseph Richards, male    DOB: 1945-06-13, 72 y.o.   MRN: [id number]  HPI Reason for referral :  Right foot dystonia  72 yo male with symptoms of Right foot turning.  He states that the foot turns on it side and his toes drag slightly when walking.  The patient states that the symptoms have worsened over the last year but have been present to a milder degree for many years before that. The patient indicates that he was never athletic but denies problems with running as a child. Patient has not twisted or injured his ankle or foot since he has been married according to his wife of 39 years.  Started using a cane this summer due to balance issues.  The patient cannot tell me whether his balance issues are related to his foot or some other problems.  Needs to use railing for steps, increased fear of falling  No UE spasms or lower ext spasm or foot turning   Patient has been diagnosed with a movement disorder.  Patient has seen both his general neurologist as well as a movement disorder specialist.  In addition patient has had EMG/NCV on 01/04/2018 which showed no abnormality on lower extremity nerve conduction studies H reflexes or needle EMG testing.  He does not have symptoms of  Parkinson's disease such as  hand tremor, bradykinesia or freezing episodes.  Pt feels sinemet is partially managing his problems with movement  Was in PT summer before last, balance and walking issues with foot turning, were addressed Pain Inventory Average Pain 0 Pain Right Now 0 My pain is na  In the last 24 hours, has pain interfered with the following? General activity 5 Relation with others 0 Enjoyment of life 8 What TIME of day is your pain at its worst? na Sleep (in general) Fair  Pain is worse with: na Pain improves with: na Relief from Meds: na  Mobility walk with assistance use a cane ability to climb steps?  yes do you drive?   yes  Function retired  Neuro/Psych weakness trouble walking  Prior Studies new patient  Physicians involved in your care new paTIENT   Family History  Problem Relation Age of Onset  . Aneurysm Mother        brain  . Lung cancer Father   . Heart attack Father   . Breast cancer Sister   . Healthy Daughter    Social History   Socioeconomic History  . Marital status: Married    Spouse name: Not on file  . Number of children: Not on file  . Years of education: Not on file  . Highest education level: Not on file  Occupational History  . 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 Financial resource strain: Not on file  . Food insecurity:    Worry: Not on file    Inability: Not on file  . Transportation needs:    Medical: Not on file    Non-medical: Not on file  Tobacco Use  . Smoking status: Former Smoker    Last attempt to quit: 09/29/1982    Years since quitting: 35.3  . Smokeless tobacco: Current User    Types: Chew  Substance and Sexual Activity  . Alcohol use: Yes    Comment: 1 drink every couple months  . Drug use: No  . Sexual activity: Not on file  Lifestyle  . Physical activity:  Days per week: Not on file    Minutes per session: Not on file  . Stress: Not on file  Relationships  . Social connections:    Talks on phone: Not on file    Gets together: Not on file    Attends religious service: Not on file    Active member of club or organization: Not on file    Attends meetings of clubs or organizations: Not on file    Relationship status: Not on file  Other Topics Concern  . Not on file  Social History Narrative  . Not on file   Past Surgical History:  Procedure Laterality Date  . HERNIA REPAIR     Past Medical History:  Diagnosis Date  . GERD (gastroesophageal reflux disease)   . Hyperlipidemia   . Hypertension    Ht 5' 8.5" (1.74 m)   Wt 171 lb 9.6 oz (77.8 kg)   BMI 25.71 kg/m   Opioid Risk Score:    Fall Risk Score:  `1  Depression screen PHQ 2/9  No flowsheet data found.   Review of Systems  Constitutional: Negative.   HENT: Negative.   Eyes: Negative.   Respiratory: Negative.   Cardiovascular: Negative.   Gastrointestinal: Positive for constipation.  Endocrine: Negative.   Genitourinary: Negative.   Musculoskeletal: Positive for gait problem.  Skin: Negative.   Allergic/Immunologic: Negative.   Neurological: Positive for weakness.  Hematological: Negative.   Psychiatric/Behavioral: Negative.   All other systems reviewed and are negative.      Objective:   Physical Exam  Constitutional: He is oriented to person, place, and time. He appears well-developed and well-nourished. No distress.  HENT:  Head: Normocephalic and atraumatic.  Eyes: Pupils are equal, round, and reactive to light. EOM are normal.  Neck: Normal range of motion. Neck supple.  Cardiovascular: Normal rate, regular rhythm and normal heart sounds. Exam reveals no friction rub.  No murmur heard. Pulmonary/Chest: Effort normal and breath sounds normal. No stridor. No respiratory distress.  Abdominal: Soft. Bowel sounds are normal. He exhibits no distension. There is no tenderness.  Neurological: He is alert and oriented to person, place, and time. He displays no atrophy and no tremor. Gait abnormal.  Reflex Scores:      Tricep reflexes are 1+ on the right side and 1+ on the left side.      Bicep reflexes are 1+ on the right side and 1+ on the left side.      Brachioradialis reflexes are 1+ on the right side and 1+ on the left side.      Patellar reflexes are 2+ on the right side and 2+ on the left side.      Achilles reflexes are 0 on the right side and 0 on the left side. Motor strength is 5/5 bilateral deltoid, bicep, tricep, grip, hip flexor, knee extensor, ankle dorsiflexor.  Sensation in tact to light touch and pinprick bilateral C5 C6-C7-C8 L2-L3-L4 L5-S1 dermatomal distribution. Gait on the  right side patient has mild inversion and plantarflexion of the foot with toe curling in the second third fourth and fifth toes.    Skin: Skin is warm and dry. He is not diaphoretic.  Psychiatric: His speech is normal and behavior is normal. Thought content normal. His affect is blunt.  Nursing note and vitals reviewed.  Left foot has hallux valgus as well as valgus at the tibiotalar joint as well as pes planus       Assessment & Plan:  #  1.  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  Based on examination, it appears the flexor digitorum longus and posterior tibialis muscles are the primary muscles affected.  Would recommend botulinum toxin injection 50 units to the FDL and 50 units to the posterior tibialis. I discussed this recommendation with the patient his wife.  We discussed that the usual onset of Botox is 1 week post injection and usual duration is 2-1/2 to 3 months.  We discussed that he will likely require reinjection.  We also discussed that 6 weeks post injection we would recheck and assess whether dosage was appropriate and if any other dosing changes or muscle selection would be recommended.  We also discussed that his balance may not improve even if his gait pattern improves.  He still may require using a cane.  We will tentatively schedule for 2 weeks from now to allow for insurance approval.  I have given patient and wife some literature to review  2.  Pes planus hallux valgus left foot recommend podiatry evaluation for foot orthotics

## 2018-02-01 NOTE — Patient Instructions (Signed)
Please call >24hrs before appt to cancel  Please let me know if you'd like therapy without Botox

## 2018-02-15 ENCOUNTER — Ambulatory Visit: Payer: Medicare Other

## 2018-02-15 ENCOUNTER — Ambulatory Visit: Payer: Medicare Other | Admitting: Physical Medicine & Rehabilitation

## 2018-05-29 ENCOUNTER — Other Ambulatory Visit: Payer: Self-pay | Admitting: Neurology

## 2018-05-29 DIAGNOSIS — G249 Dystonia, unspecified: Secondary | ICD-10-CM

## 2018-08-25 ENCOUNTER — Encounter: Payer: Self-pay | Admitting: General Surgery

## 2018-08-25 ENCOUNTER — Telehealth: Payer: Self-pay | Admitting: General Surgery

## 2018-08-25 NOTE — Telephone Encounter (Signed)
Pt states he took his BP 1015/2020 at [facility] and it was 119/78 with a pulse of 72. He also added that he weighs 182lb ht 5 8in

## 2018-08-26 ENCOUNTER — Encounter: Payer: Self-pay | Admitting: Neurology

## 2018-08-26 ENCOUNTER — Telehealth (INDEPENDENT_AMBULATORY_CARE_PROVIDER_SITE_OTHER): Payer: Medicare Other | Admitting: Neurology

## 2018-08-26 ENCOUNTER — Other Ambulatory Visit: Payer: Self-pay

## 2018-08-26 VITALS — BP 119/78 | HR 72 | Ht 68.0 in | Wt 182.0 lb

## 2018-08-26 DIAGNOSIS — R2681 Unsteadiness on feet: Secondary | ICD-10-CM

## 2018-08-26 DIAGNOSIS — G249 Dystonia, unspecified: Secondary | ICD-10-CM

## 2018-08-26 NOTE — Progress Notes (Addendum)
Virtual Visit via Video Note The purpose of this virtual visit is to provide medical care while limiting exposure to the novel coronavirus.    Consent was obtained for video visit:  Yes.   Answered questions that patient had about telehealth interaction:  Yes.   I discussed the limitations, risks, security and privacy concerns of performing an evaluation and management service by telemedicine. I also discussed with the patient that there may be a patient responsible charge related to this service. The patient expressed understanding and agreed to proceed.  Pt location: Home Physician Location: office Name of referring provider:  Lupita RaiderShaw, Kimberlee, MD I connected with Joseph Medalalph B Briles at patients initiation/request on 08/26/2018 at 11:30 AM EDT by video enabled telemedicine application and verified that I am speaking with the correct person using two identifiers. Pt MRN:  [id number] Pt DOB:  Feb 05, 1946 Video Participants:  Joseph Richards;  Debbra Ridingnne Gettinger (wife)   History of Present Illness:  The patient was last seen in August 2019 for gait difficulties and right foot dystonia. Since his last visit, he has been evaluated by Dr. Wynn BankerKirsteins for Botox, which patient declined, then he [facility] Arlana [facility]ouchate, with a diagnosis of dystonia and ataxia. Records were reviewed, additional bloodwork came back normal for Anti-GAD Ab, ceruloplasmin, copper, vitamin E, MuSK Ab, acetylcholine Ab, RPR. He had genetic testing with Dr. Arbutus Leasat with negative SCA panel and DYT1. They had discussed Botox and he was not ready. It was discussed that unless underlying cause for symptoms is found, if this is neurodegenerative in origin, treatment would be symptomatic. Of note, he has had an extensive evaluation with two EMG/NCV studies (2018 and 2019) which were normal. MRI brain in 2018 unremarkable, MRI lumbar spine mild degenerative changes, MRI cervical and thoracic  spine showed degenerative changes, no myelopathy. He did a trial of Sinemet, currently on 25/100mg  2 tabs TID, he has not noticed any improvement in symptoms, he states he is about where he was in August but his wife reports his gait is definitely worse. He is shuffling more than walking, worse with his right foot. It seems to get better over the course of the day, but significantly worse since August, per wife. They deny any tremors or bradykinesia. He had a fall last month when he missed a step, his wife feels if he did not grab on to things, he may have more falls. He thinks this is more of a balance issue, he uses a cane for longer distances. He denies any numbness/tingling, no back pain.   HPI 08/04/2016: This is a pleasant 73 yo LH man with a history of hypertension, hyperlipidemia, who presented with gait difficulties. He and his wife report that he has always had "somewhat of a limp" that becomes more pronounced throughout the day, but over the past 10-12 months, he started having problems where he would feel stiffness in his legs after he sits for a period of time then gets up to walk. He does not necessarily feel it his his joints, he indicates the stiffness and tightness appears to be in his upper thighs extending down the knee when he gets up. He would be awkward when he first gets up, but gets better as the day goes on. He denies any numbness or tingling, no back pain or neck pain. His wife reports that he does have back pain and feels he will need a walker within the year.  They deny any falls. No bowel/bladder dysfunction. They have been married more than 38 years and she has always noticed he drags the right foot a little more. He states right foot difficulties were from injury from a pitchfork when he was a child. He has always had difficulties going down stairs. He has a poor sense of balance but is gotten worse. Recently she also noticed that he would have slurred speech that comes and goes a  couple of times a day, worse later in the day. He notices he tends to drool on the right side of his mouth sometimes. There is no family history of similar symptoms. He has occasional sinus headaches, no dizziness, diplopia, dysarthria/dysphagia. There was concern for some right sided facial asymmetry, not noticeable in the office today.   Diagnostic Data: I personally reviewed MRI brain with and without contrast done 06/2016 which did not show any acute changes. 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 MRI lumbar spine without contrast which did not show any acute changes, there was no evidence of nerve impingement or lower cord myelopathy, there were mild degenerative changes. EMG/NCV of both lower extremities was normal, no evidence of neuropathy or myelopathy. Bloodwork showed normal CK, RF, and B12.    Observations/Objective:   Vitals:   08/26/18 1256  BP: 119/78  Pulse: 72  Weight: 182 lb (82.6 kg)  Height:  (1.727 m)   GEN:  The patient appears stated age and is in NAD.  Neurological examination:  Orientation: The patient is alert and oriented x3. Cranial nerves: There is good facial symmetry. There is no facial hypomimia.  The speech is fluent and clear. No tongue deviation. Hearing is intact to conversational tone. Motor: Strength is at least antigravity x 4.   Shoulder shrug is equal and symmetric.  There is no pronator drift.   Movement examination: Tone: unable Abnormal movements: none Coordination:  There is no incoordination on finger to nose testing. No decremation with RAM's with hand opening and closing, finger taps bilaterally Gait and Station: He is again noted to walk with right foot turned inward with right foot shuffling heard with each stride, fair arm swing  Assessment and Plan:   This is a pleasant 73 yo LH man with a history of hypertension,  hyperlipidemia, who presented with stiffness and gait difficulties. He has had chronic right foot problems that have caused him to limp, but symptoms have worsened over the past few years, to the point where his right foot drags and turns in on ambulation. He ambulates with knees bent. He also reports leg stiffness and heaviness. He has had an extensive evaluation with MRI brain, cervical/thoracic/lumbar spine, EMG/NCV x 2 which have been unremarkable. He has seen 2 Movement Disorder specialists with a diagnosis of dystonia, genetic testing and dystonia labs negative. Symptomatic treatment with Botox has been discussed by Dr. Wynn Banker and Dr. Arlana Pouch, however patient declined each time, we had an extensive discussion of the diagnosis and treatment recommendations, we discussed his trepidations about Botox, it appears he did not fully understand and now agrees to try Botox. They will contact Dr. Wynn Banker. Since he has not noticed much benefit from Sinemet, they will start weaning off medication. Follow-up after he has had 2 sessions of Botox. They know to call for any changes.   Follow Up Instructions:   -I discussed the assessment and treatment plan with the patient. The patient was  provided an opportunity to ask questions and all were answered. The patient agreed with the plan and demonstrated an understanding of the instructions.   The patient was advised to call back or seek an in-person evaluation if the symptoms worsen or if the condition fails to improve as anticipated.    Total Time spent in visit with the patient was 25 minutes, of which more than 50% of the time was spent in counseling and/or coordinating care on the above.   Pt understands and agrees with the plan of care outlined.     Van Clines, MD

## 2018-08-30 ENCOUNTER — Ambulatory Visit (HOSPITAL_BASED_OUTPATIENT_CLINIC_OR_DEPARTMENT_OTHER): Payer: Medicare Other | Admitting: Physical Medicine & Rehabilitation

## 2018-08-30 ENCOUNTER — Other Ambulatory Visit: Payer: Self-pay

## 2018-08-30 ENCOUNTER — Encounter: Payer: Self-pay | Admitting: Physical Medicine & Rehabilitation

## 2018-08-30 ENCOUNTER — Encounter: Payer: Medicare Other | Attending: Physical Medicine & Rehabilitation

## 2018-08-30 DIAGNOSIS — G248 Other dystonia: Secondary | ICD-10-CM | POA: Diagnosis not present

## 2018-08-30 NOTE — Progress Notes (Signed)
Subjective:    Patient ID: Joseph Richards, male    DOB: 1945/08/10, 73 y.o.   MRN: [id number] Initial physical medicine and rehabilitation evaluation was  September 24th, 2019 HPI Referred by Neurology, Dr Karel Jarvis for botulinum toxin  Patient has had symptoms gait difficulties right foot dragging and turning in for greater than 1 year he has been evaluated by 2 movement disorder neurologist he has seen me for initial evaluation to discuss botulinum toxin treatment but the patient did not wish to initiate treatment.  The patient is now willing to try injection therapy  VIRTUAL VISIT -patient is at home  The patient states that his right foot continues to turn in.  He also notes toe curling of the small toes on the right foot but not large toe. Symptoms affect his ambulation. He has had no new injuries to his foot. He has had recent reevaluation by neurology on 08/26/2018 and recommendation was made to reestablish contact with this office. Pain Inventory Average Pain 0 Pain Right Now 0 My pain is no pain  In the last 24 hours, has pain interfered with the following? General activity 0 Relation with others 0 Enjoyment of life 0 What TIME of day is your pain at its worst? no pain Sleep (in general) Fair  Pain is worse with: no pain Pain improves with: no pain Relief from Meds: no pain  Mobility walk without assistance use a cane ability to climb steps?  yes do you drive?  yes  Function retired  Neuro/Psych weakness  Prior Studies Any changes since last visit?  yes some renal impairment, will be following up with DR Clelia Croft, and will be stopping the sinemet  Physicians involved in your care Primary care [facility]   Family History  Problem Relation Age of Onset  . Aneurysm Mother        brain  . Lung cancer Father   . Heart attack Father   . Breast cancer Sister   . Healthy Daughter    Social History   Socioeconomic History  . Marital status:  Married    Spouse name: Not on file  . Number of children: Not on file  . Years of education: Not on file  . Highest education level: Not on file  Occupational History  . 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 Financial resource strain: Not on file  . Food insecurity:    Worry: Not on file    Inability: Not on file  . Transportation needs:    Medical: Not on file    Non-medical: Not on file  Tobacco Use  . Smoking status: Former Smoker    Last attempt to quit: 09/29/1982    Years since quitting: 35.9  . Smokeless tobacco: Current User    Types: Chew  Substance and Sexual Activity  . Alcohol use: Yes    Comment: 1 drink every couple months  . Drug use: No  . Sexual activity: Not on file  Lifestyle  . Physical activity:    Days per week: Not on file    Minutes per session: Not on file  . Stress: Not on file  Relationships  . Social connections:    Talks on phone: Not on file    Gets together: Not on file    Attends religious service: Not on file    Active member of club or organization: Not on file    Attends meetings of clubs  or organizations: Not on file    Relationship status: Not on file  Other Topics Concern  . Not on file  Social History Narrative  . Not on file   Past Surgical History:  Procedure Laterality Date  . HERNIA REPAIR     Past Medical History:  Diagnosis Date  . GERD (gastroesophageal reflux disease)   . Hyperlipidemia   . Hypertension    There were no vitals taken for this visit.  Opioid Risk Score:   Fall Risk Score:  `1  Depression screen PHQ 2/9  Depression screen PHQ 2/9 08/30/2018  Decreased Interest 0  Down, Depressed, Hopeless 0  PHQ - 2 Score 0   Review of Systems  Constitutional: Negative.   HENT: Negative.   Eyes: Negative.   Respiratory: Negative.   Cardiovascular: Negative.   Gastrointestinal: Negative.   Endocrine: Negative.   Genitourinary: Negative.   Musculoskeletal: Negative.    Skin: Negative.   Allergic/Immunologic: Negative.   Neurological: Positive for weakness.  Hematological: Negative.   Psychiatric/Behavioral: Negative.   All other systems reviewed and are negative.      Objective:   Physical Exam  General no acute distress Mood and affect without lability agitation Speech without dysarthria or aphasia vocal volume is good. Remainder of examination unable to perform secondary to tele-visit      Assessment & Plan:  1.  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  Recommend botulinum toxin treatment as per previous note would recommend 50 units to the posterior tibialis and 50 units to the flexor digitorum longus. We discussed that these dosages may need to be adjusted based on clinical response.  We discussed that the onset of effect is usually 1 to 2 weeks and the duration of effect is approximately 3 months.  We discussed that a follow-up visit is made routinely 6 weeks after the injection to monitor clinical effect.  The patient is in agreement.  Televisit duration 15minutes

## 2018-08-30 NOTE — Patient Instructions (Signed)
As discussed botulinum toxin will take 1 week to take effect.  A 6-week follow-up as needed to monitor the clinical effect. Botulinum toxin dose adjustments as well as changes with muscle group selection may be necessary depending on clinical response. Total duration of effect for botulinum toxin injection is 3 months.

## 2018-08-31 ENCOUNTER — Ambulatory Visit: Payer: Medicare Other

## 2018-09-08 ENCOUNTER — Telehealth: Payer: Self-pay | Admitting: Neurology

## 2018-09-08 NOTE — Telephone Encounter (Signed)
Patient came into the office today needing to know where to take his extra medication to be dropped off. He said he was taking 2 tablets before each meal so 6 per day, then he was to wean off to 1 tab instead of 2. Now he is not taking any at all. Thanks

## 2018-09-22 ENCOUNTER — Other Ambulatory Visit: Payer: Self-pay

## 2018-09-22 ENCOUNTER — Encounter: Payer: Medicare Other | Attending: Physical Medicine & Rehabilitation | Admitting: Physical Medicine & Rehabilitation

## 2018-09-22 ENCOUNTER — Encounter: Payer: Self-pay | Admitting: Physical Medicine & Rehabilitation

## 2018-09-22 VITALS — BP 117/78 | HR 81 | Temp 98.4°F | Ht 68.0 in | Wt 180.0 lb

## 2018-09-22 DIAGNOSIS — G248 Other dystonia: Secondary | ICD-10-CM | POA: Diagnosis not present

## 2018-09-22 NOTE — Patient Instructions (Signed)

## 2018-09-22 NOTE — Progress Notes (Signed)
Botox Injection for focal dystonia using needle EMG guidance  Dilution: 50 Units/ml Indication: Severe spasticity which interferes with ADL,mobility and/or  hygiene and is unresponsive to medication management and other conservative care Informed consent was obtained after describing risks and benefits of the procedure with the patient. This includes bleeding, bruising, infection, excessive weakness, or medication side effects. A REMS form is on file and signed. Needle: 25g 2" needle electrode Number of units per muscle RIGHT FDL 50 Post tibialis 50 All injections were done after obtaining appropriate EMG activity and after negative drawback for blood. The patient tolerated the procedure well. Post procedure instructions were given. A followup appointment was made.

## 2018-09-23 ENCOUNTER — Telehealth: Payer: Self-pay | Admitting: Neurology

## 2018-09-23 IMAGING — MR MR CERVICAL SPINE W/O CM
4 of 6 series · 13 of 48 positions shown · non-contrast
Comparison: None.

CLINICAL DATA: Ataxia.

EXAM:
MRI CERVICAL SPINE WITHOUT CONTRAST
MRI THORACIC SPINE WITHOUT CONTRAST
TECHNIQUE: Multiplanar and multiecho pulse sequences of the cervical and
thoracic spine were obtained without intravenous contrast.

[Series 7: T2 · sagittal · 4.0mm · 0.38mm/px · 4 of 12 slices shown (1 of 3)]
[im 1/12]
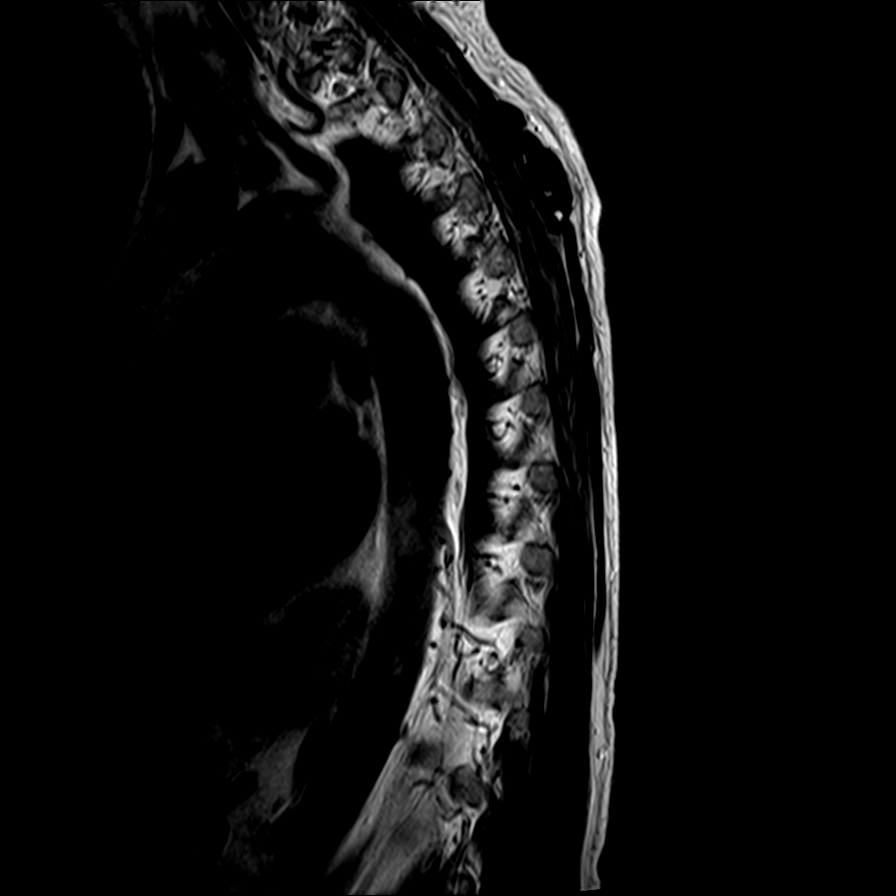
[im 3/12]
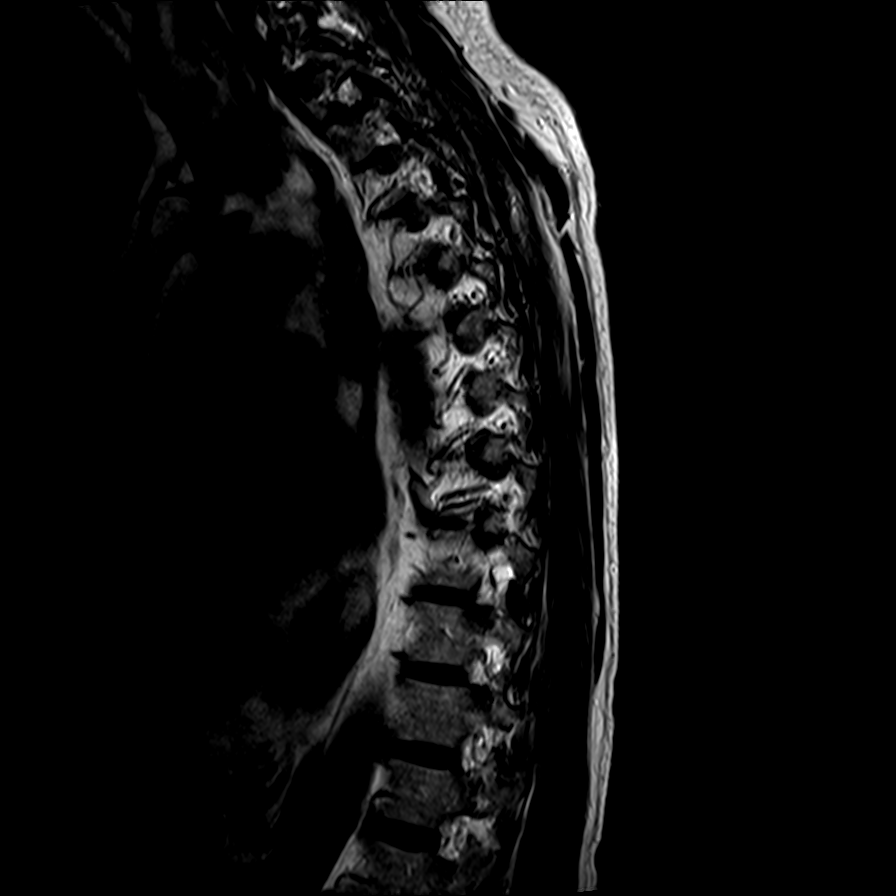
[im 6/12]
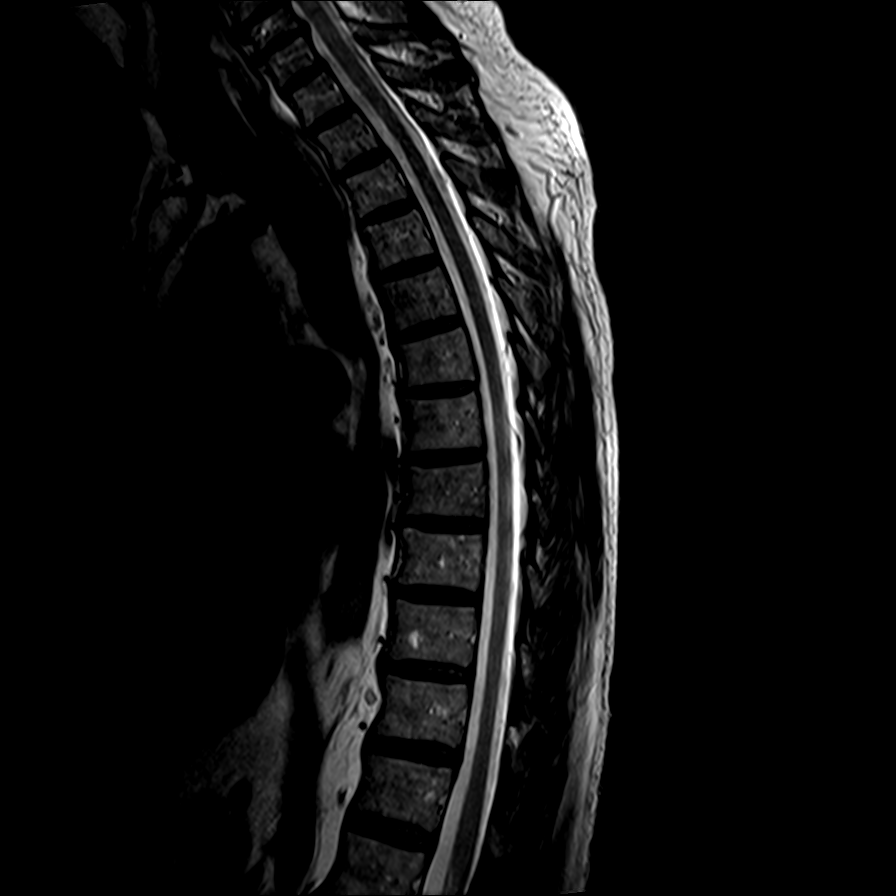
[im 12/12]
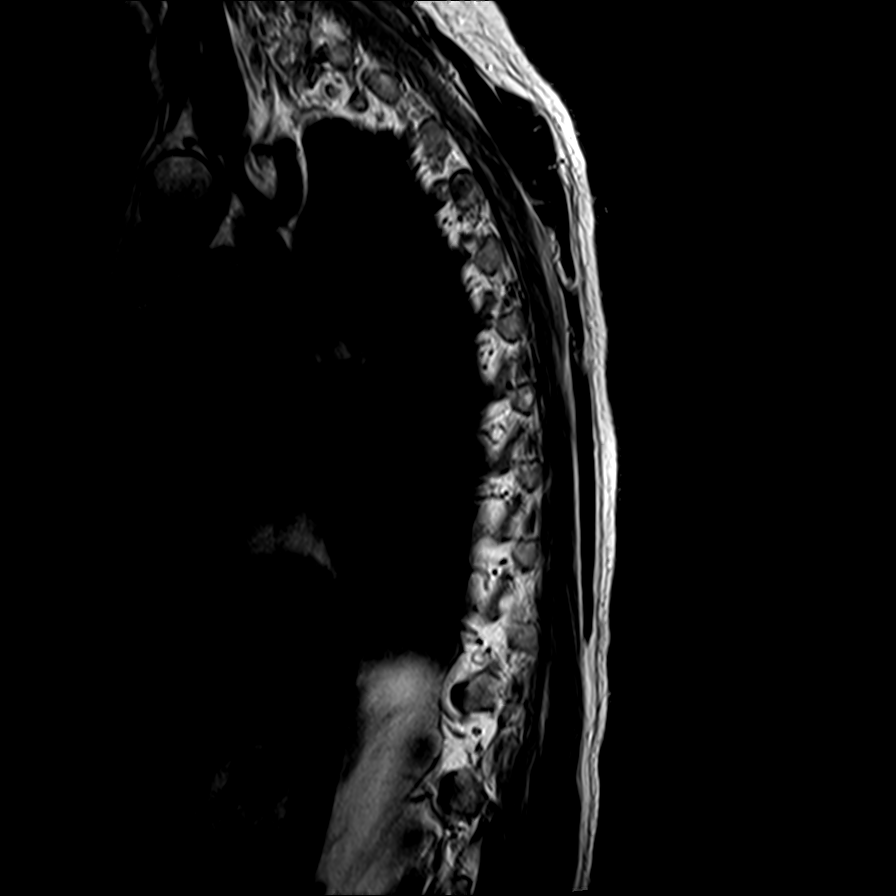

[Series 9: T1 · sagittal · 4.0mm · 0.44mm/px · 3 of 12 slices shown]
[im 1/12]
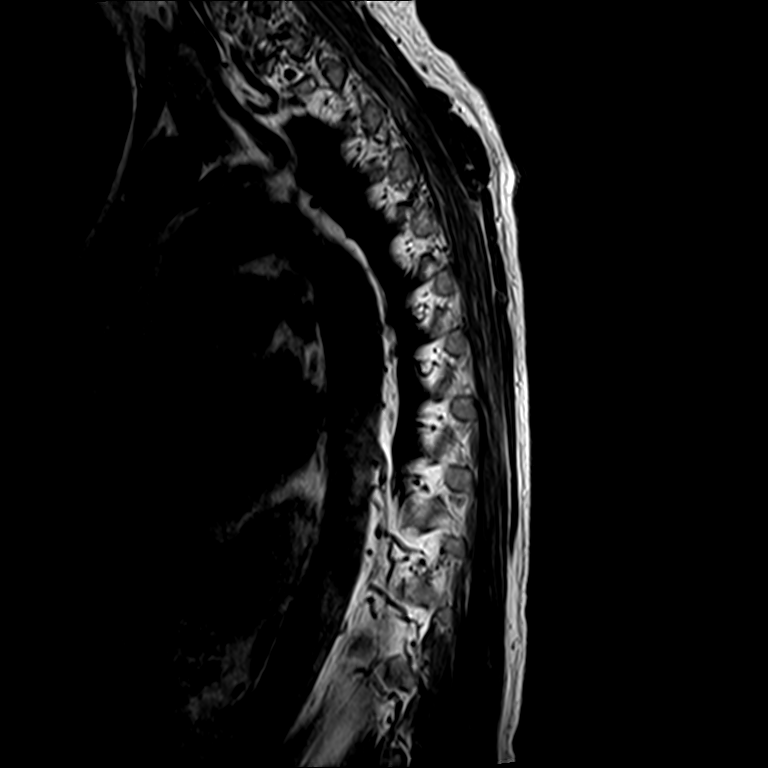
[im 6/12]
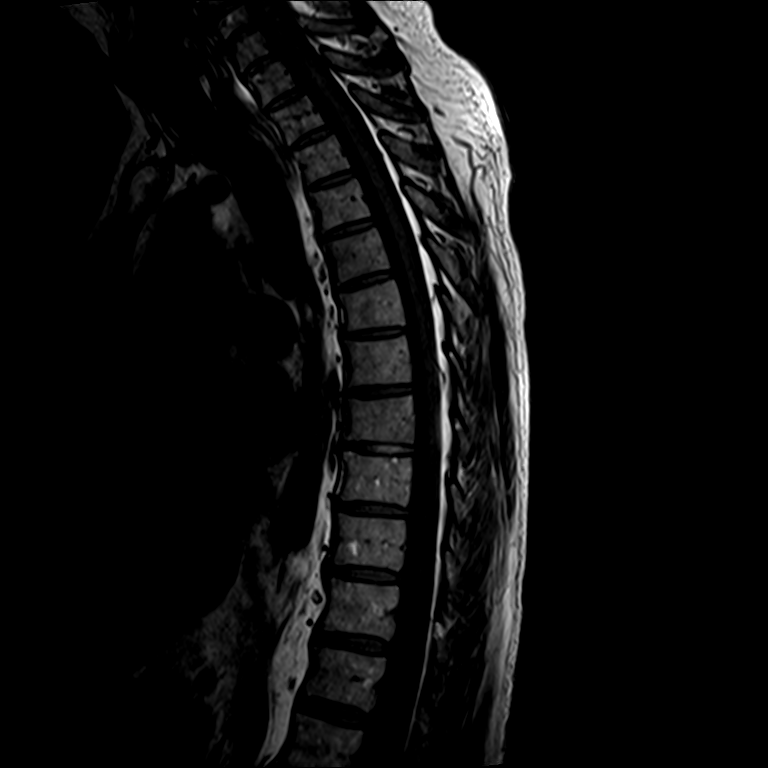
[im 12/12]
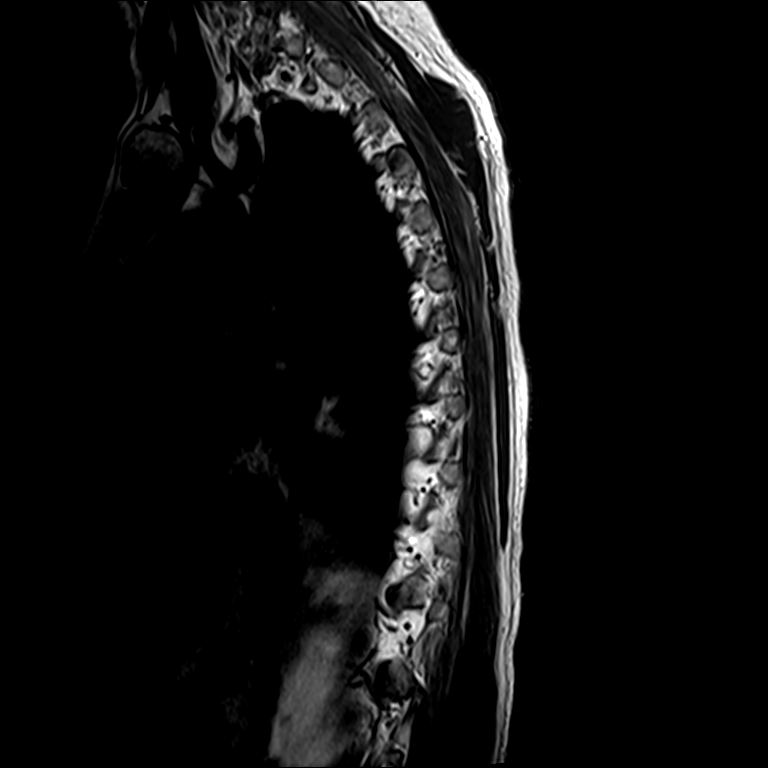

[Series 10: T2 · axial · 4.0mm · 0.78mm/px · z∈[-285,-128]mm · 3 of 36 slices shown (2 of 3)]
[im 6/36]
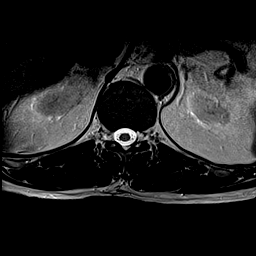
[im 18/36]
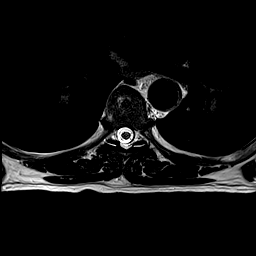
[im 31/36]
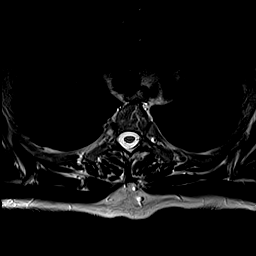

[Series 11: T2 · axial · 4.0mm · 0.78mm/px · z∈[-289,-119]mm · 3 of 36 slices shown (3 of 3)]
[im 6/36]
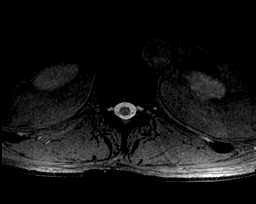
[im 18/36]
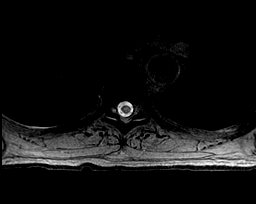
[im 31/36]
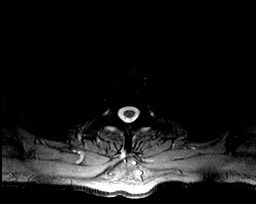

[13 of 48 positions shown; findings below may reference images not displayed]

FINDINGS: MRI CERVICAL SPINE FINDINGS

Alignment: Grade 1 retrolisthesis at C3-C4.

Vertebrae: No fracture, evidence of discitis, or bone lesion.

Cord: Normal signal and morphology.

Posterior Fossa, vertebral arteries, paraspinal tissues: Negative.

Disc levels:

C1-2: Normal

C2-C3: Normal.

C3-4: Medium-sized disc osteophyte complex with narrowing of the
ventral thecal sac but no spinal canal stenosis.
Left-greater-than-right facet hypertrophy with severe left foraminal
stenosis.

C4-C5: Mild facet hypertrophy with mild left foraminal narrowing.

C5-C6: Disc space narrowing with small bulge.  No stenosis.

C6-C7: Disc space narrowing with small bulge.  No stenosis.

C7-T1: Normal.

MRI THORACIC SPINE FINDINGS

Alignment:  Physiologic.

Vertebrae: No fracture, evidence of discitis, or bone lesion.

Cord:  Normal signal and morphology.

Paraspinal and other soft tissues: Negative.

Disc levels:

No disc herniation or stenosis.
IMPRESSION: 1. Severe left C4 foraminal stenosis, primarily due to facet
arthrosis.
2. Mild left C5 foraminal stenosis.
3. Normal thoracic spine.

## 2018-09-23 NOTE — Telephone Encounter (Signed)
Patient has had Botox injection with Dr. Wynn Banker yesterday and is scheduled to go back in June. And just wanted to let you know. Thanks!

## 2018-09-26 NOTE — Telephone Encounter (Signed)
He is now scheduled for 11/09/18 at 2:30PM. Thanks!

## 2018-09-26 NOTE — Telephone Encounter (Signed)
Thanks Chelsea, Pls let them know I got his notes and hope this helps him better, I can see him after their June appt with Dr. Wynn Banker. Pls put him on for Jul 1, I have a f/u appointment opening that day. Thanks!

## 2018-11-03 ENCOUNTER — Encounter: Payer: Medicare Other | Admitting: Physical Medicine & Rehabilitation

## 2018-11-04 ENCOUNTER — Telehealth: Payer: Self-pay | Admitting: Neurology

## 2018-11-04 NOTE — Telephone Encounter (Signed)
Does he need to wait until after next botox injection to have his appointment

## 2018-11-04 NOTE — Telephone Encounter (Signed)
Pt called says he has not had 2nd injection yet and will have it in August. Pt states he probably does not need to come 11/09/18 for [facility] appt. Please advise [phone].

## 2018-11-04 NOTE — Telephone Encounter (Signed)
Agree, schedule f/u after next Botox, thanks!

## 2018-11-09 ENCOUNTER — Ambulatory Visit: Payer: Medicare Other | Admitting: Neurology

## 2018-11-21 ENCOUNTER — Encounter: Payer: Medicare Other | Attending: Physical Medicine & Rehabilitation | Admitting: Physical Medicine & Rehabilitation

## 2018-11-21 ENCOUNTER — Other Ambulatory Visit: Payer: Self-pay

## 2018-11-21 ENCOUNTER — Encounter: Payer: Self-pay | Admitting: Physical Medicine & Rehabilitation

## 2018-11-21 VITALS — BP 147/84 | HR 62 | Temp 97.5°F | Ht 68.0 in | Wt 182.8 lb

## 2018-11-21 DIAGNOSIS — G248 Other dystonia: Secondary | ICD-10-CM | POA: Diagnosis not present

## 2018-11-21 NOTE — Progress Notes (Signed)
Subjective:    Patient ID: Joseph Richards, male    DOB: [birth year], 73 y.o.   MRN: [id number]  HPI  73 year old male with history of focal dystonia.  He mainly complains of right foot inversion but also has toe flexion of the small toes on the right side.  Discussed focal dystonia as well as botulinum toxin treatment. Patient did not feel like his first treatment was particularly helpful. Botox  5/14  FDL 50 Post tibialis 50  The patient indicates that his right foot still turns over and he still has some toe curling.  He does not have much pain in the right side.  He does have a history of a bunion on the left side which is sometimes painful Pain Inventory Average Pain 6 Pain Right Now 6 My pain is aching  In the last 24 hours, has pain interfered with the following? General activity 4 Relation with others 0 Enjoyment of life 0 What TIME of day is your pain at its worst? evening Sleep (in general) Fair  Pain is worse with: changing from sitting to standing Pain improves with: pacing activities Relief from Meds: 0  Mobility walk with assistance use a cane how many minutes can you walk? 10 do you drive?  yes  Function Do you have any goals in this area?  yes  Neuro/Psych trouble walking  Prior Studies Any changes since last visit?  no  Physicians involved in your care Any changes since last visit?  no   Family History  Problem Relation Age of Onset  . Aneurysm Mother        brain  . Lung cancer Father   . Heart attack Father   . Breast cancer Sister   . Healthy Daughter    Social History   Socioeconomic History  . Marital status: Married    Spouse name: Not on file  . Number of children: Not on file  . Years of education: Not on file  . Highest education level: Not on file  Occupational History  . 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 Financial resource strain: Not on file  . Food insecurity    Worry: Not on file     Inability: Not on file  . Transportation needs    Medical: Not on file    Non-medical: Not on file  Tobacco Use  . Smoking status: Former Smoker    Quit date: 09/29/1982    Years since quitting: 36.1  . Smokeless tobacco: Current User    Types: Chew  Substance and Sexual Activity  . Alcohol use: Yes    Comment: 1 drink every couple months  . Drug use: No  . Sexual activity: Not on file  Lifestyle  . Physical activity    Days per week: Not on file    Minutes per session: Not on file  . Stress: Not on file  Relationships  . Social Musicianconnections    Talks on phone: Not on file    Gets together: Not on file    Attends religious service: Not on file    Active member of club or organization: Not on file    Attends meetings of clubs or organizations: Not on file    Relationship status: Not on file  Other Topics Concern  . Not on file  Social History Narrative  . Not on file   Past Surgical History:  Procedure Laterality Date  . HERNIA REPAIR  Past Medical History:  Diagnosis Date  . GERD (gastroesophageal reflux disease)   . Hyperlipidemia   . Hypertension    BP (!) 147/84   Pulse 62   Temp (!) 97.5 F (36.4 C)   Ht 5\' 8"  (1.727 m)   Wt 182 lb 12.8 oz (82.9 kg)   SpO2 98%   BMI 27.79 kg/m   Opioid Risk Score:   Fall Risk Score:  `1  Depression screen PHQ 2/9  Depression screen PHQ 2/9 08/30/2018  Decreased Interest 0  Down, Depressed, Hopeless 0  PHQ - 2 Score 0     Review of Systems  Constitutional: Negative.   HENT: Negative.   Eyes: Negative.   Respiratory: Negative.   Cardiovascular: Negative.   Gastrointestinal: Negative.   Endocrine: Negative.   Genitourinary: Negative.   Musculoskeletal: Positive for gait problem.  Skin: Negative.   Allergic/Immunologic: Negative.   Hematological: Bruises/bleeds easily.  Psychiatric/Behavioral: Negative.   All other systems reviewed and are negative.      Objective:   Physical Exam Vitals signs  and nursing note reviewed.  Constitutional:      Appearance: Normal appearance.  HENT:     Head: Normocephalic and atraumatic.     Mouth/Throat:     Mouth: Mucous membranes are moist.  Eyes:     Extraocular Movements: Extraocular movements intact.     Pupils: Pupils are equal, round, and reactive to light.  Neurological:     Mental Status: He is alert and oriented to person, place, and time.     Gait: Gait abnormal.  Psychiatric:        Mood and Affect: Mood normal.        Behavior: Behavior normal.    Right foot no evidence of intrinsic atrophy skin color is good foot is warm he has good range of motion at the toes as well as at the ankle. At rest he does have mild inversion which increases during ambulation. He has no clawing of the toes except with weightbearing.  This is in digits 2345.  No clawing or deformities of the right great toe. Sensations normal in the right foot. With gait he does have inversion as well as toe curling on the right side. Left lower extremity has severe hallux valgus with overriding toe.  He does have foot intrinsic atrophy on the left side as well.  Normal sensation normal skin temperature. His motor strength is normal in the ankle dorsiflexor and plantar flexor bilaterally.      Assessment & Plan:  1.  Focal dystonia idiopathic he does have left foot deformities and may have developed a compensatory gait pattern which predisposed him to the focal dystonia. We discussed botulinum toxin dosing.  Would recommend increased dosing as follows Posterior tibialis right 75 units Flexor digitorum longus right 75 units Soleus right 50 vs flexor digitorum brevis 50 units right

## 2018-11-21 NOTE — Patient Instructions (Signed)
Will increase botox next injection

## 2018-12-27 ENCOUNTER — Ambulatory Visit: Payer: Medicare Other | Admitting: Neurology

## 2018-12-27 ENCOUNTER — Encounter

## 2018-12-30 ENCOUNTER — Encounter: Payer: Medicare Other | Attending: Physical Medicine & Rehabilitation | Admitting: Physical Medicine & Rehabilitation

## 2018-12-30 ENCOUNTER — Encounter: Payer: Self-pay | Admitting: Physical Medicine & Rehabilitation

## 2018-12-30 ENCOUNTER — Other Ambulatory Visit: Payer: Self-pay

## 2018-12-30 VITALS — BP 128/80 | HR 70 | Temp 97.8°F | Ht 68.0 in | Wt 184.8 lb

## 2018-12-30 DIAGNOSIS — G248 Other dystonia: Secondary | ICD-10-CM | POA: Insufficient documentation

## 2018-12-30 NOTE — Patient Instructions (Signed)

## 2018-12-30 NOTE — Progress Notes (Signed)
Botox Injection for spasticity using needle EMG guidance  Dilution: 50 Units/ml Indication: Severe spasticity which interferes with ADL,mobility and/or  hygiene and is unresponsive to medication management and other conservative care Informed consent was obtained after describing risks and benefits of the procedure with the patient. This includes bleeding, bruising, infection, excessive weakness, or medication side effects. A REMS form is on file and signed. Needle: 25g 2" needle electrode Number of units per muscle RIGHT Post tib 75U FDL 75U Soleus 50U All injections were done after obtaining appropriate EMG activity and after negative drawback for blood. The patient tolerated the procedure well. Post procedure instructions were given. A followup appointment was made.

## 2019-01-06 ENCOUNTER — Ambulatory Visit (INDEPENDENT_AMBULATORY_CARE_PROVIDER_SITE_OTHER): Payer: Medicare Other | Admitting: Neurology

## 2019-01-06 ENCOUNTER — Encounter: Payer: Self-pay | Admitting: Neurology

## 2019-01-06 VITALS — BP 152/82 | HR 72 | Ht 68.0 in | Wt 184.1 lb

## 2019-01-06 DIAGNOSIS — R2681 Unsteadiness on feet: Secondary | ICD-10-CM

## 2019-01-06 DIAGNOSIS — G249 Dystonia, unspecified: Secondary | ICD-10-CM

## 2019-01-06 NOTE — Patient Instructions (Signed)
1. Continue follow-up with Dr. Letta Pate, let me know if another Botox will be scheduled and we will schedule a follow-up with me after  2. Resume exercises once able, if no change in leg tightness, re-try the Sinemet  Take care and stay safe!

## 2019-01-06 NOTE — Progress Notes (Signed)
NEUROLOGY FOLLOW UP OFFICE NOTE  Joseph Richards [id number] Jan 21, 1946  HISTORY OF PRESENT ILLNESS: I had the pleasure of seeing Joseph Richards in follow-up in the neurology clinic on 01/06/2019.  The patient was last seen 4 months ago for gait difficulties and right foot dystonia. He has been evaluated by 2 Movement Disorders specialists with a diagnosis of dystonia and ataxia. Botox was offered, he has opted to have this done locally and has done 2 sessions with Dr. Letta Pate. He did not notice any change after the first injection, dose was increased with second session last week. He has not noticed much change so far, symptoms are worse first thing in the morning and before he goes to bed. He feels stiff when he gets up and after sitting for prolonged periods. He had stopped the Sinemet and feels that maybe it was helping with the burning sensation in his knees/leg stiffness after bending down for a long time, but he has not been exercising recently as well due to pandemic restrictions on the gym. He also has a bunion on his foot which is affecting his gait. He denies any headaches, dizziness, vision changes, focal numbness/tingling/weakness, no falls.   HPI 08/04/2016: This is a pleasant 73 yo LH man with a history of hypertension, hyperlipidemia, who presented with gait difficulties. He and his wife report that he has always had "somewhat of a limp" that becomes more pronounced throughout the day, but over the past 10-12 months, he started having problems where he would feel stiffness in his legs after he sits for a period of time then gets up to walk. He does not necessarily feel it his his joints, he indicates the stiffness and tightness appears to be in his upper thighs extending down the knee when he gets up. He would be awkward when he first gets up, but gets better as the day goes on. He denies any numbness or tingling, no back pain or neck pain. His wife reports that he does have back pain and  feels he will need a walker within the year. They deny any falls. No bowel/bladder dysfunction. They have been married more than 38 years and she has always noticed he drags the right foot a little more. He states right foot difficulties were from injury from a pitchfork when he was a child. He has always had difficulties going down stairs. He has a poor sense of balance but is gotten worse. Recently she also noticed that he would have slurred speech that comes and goes a couple of times a day, worse later in the day. He notices he tends to drool on the right side of his mouth sometimes. There is no family history of similar symptoms. He has occasional sinus headaches, no dizziness, diplopia, dysarthria/dysphagia. There was concern for some right sided facial asymmetry, not noticeable in the office today.   Update: He had a third opinion from Movement Disorders specialist at [facility] Dr. Hall Busing, with a diagnosis of dystonia and ataxia. Records were reviewed, additional bloodwork came back normal for Anti-GAD Ab, ceruloplasmin, copper, vitamin E, MuSK Ab, acetylcholine Ab, RPR. He had genetic testing with Dr. Carles Collet with negative SCA panel and DYT1. They had discussed Botox and he was not ready. It was discussed that unless underlying cause for symptoms is found, if this is neurodegenerative in origin, treatment would be symptomatic. Of note, he has had an extensive evaluation with two EMG/NCV studies (2018 and 2019) which were normal. MRI brain in  2018 unremarkable, MRI lumbar spine mild degenerative changes, MRI cervical and thoracic spine showed degenerative changes, no myelopathy. He did a trial of Sinemet, currently on 25/100mg  2 tabs TID, he has not noticed any improvement in symptoms, he states he is about where he was in August but his wife reports his gait is definitely worse. He is shuffling more than walking, worse with his right foot. It seems to get better over the course of the day, but significantly  worse since August, per wife. They deny any tremors or bradykinesia.  Diagnostic Data: I personally reviewed MRI brain with and without contrast done 06/2016 which did not show any acute changes. 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 MRI lumbar spine without contrast which did not show any acute changes, there was no evidence of nerve impingement or lower cord myelopathy, there were mild degenerative changes. EMG/NCV of both lower extremities was normal, no evidence of neuropathy or myelopathy. Bloodwork showed normal CK, RF, and B12.    PAST MEDICAL HISTORY: Past Medical History:  Diagnosis Date   GERD (gastroesophageal reflux disease)    Hyperlipidemia    Hypertension     MEDICATIONS: Current Outpatient Medications on File Prior to Visit  Medication Sig Dispense Refill   aspirin EC 81 MG tablet Take 81 mg by mouth daily.     MULTIPLE VITAMIN PO Take by mouth.     Omega-3 Fatty Acids (FISH OIL) 1000 MG CAPS Take by mouth.     pravastatin (PRAVACHOL) 20 MG tablet      No current facility-administered medications on file prior to visit.     ALLERGIES: No Known Allergies  FAMILY HISTORY: Family History  Problem Relation Age of Onset   Aneurysm Mother        brain   Lung cancer Father    Heart attack Father    Breast cancer Sister    Healthy Daughter     SOCIAL HISTORY: Social History   Socioeconomic History   Marital status: Married    Spouse name: Not on file   Number of children: Not on file   Years of education: Not on file   Highest education level: Not on file  Occupational History   Occupation: retired    Comment: office supply sales  Social Needs   Financial resource strain: Not on file   Food insecurity    Worry: Not on file    Inability: Not on file   Transportation needs    Medical: Not on file    Non-medical: Not on file   Tobacco Use   Smoking status: Former Smoker    Quit date: 09/29/1982    Years since quitting: 36.2   Smokeless tobacco: Current User    Types: Chew  Substance and Sexual Activity   Alcohol use: Yes    Comment: 1 drink every couple months   Drug use: No   Sexual activity: Not on file  Lifestyle   Physical activity    Days per week: Not on file    Minutes per session: Not on file   Stress: Not on file  Relationships   Social connections    Talks on phone: Not on file    Gets together: Not on file    Attends religious service: Not on file    Active member of club or organization: Not on file    Attends meetings of clubs or organizations: Not on file  Relationship status: Not on file   Intimate partner violence    Fear of current or ex partner: Not on file    Emotionally abused: Not on file    Physically abused: Not on file    Forced sexual activity: Not on file  Other Topics Concern   Not on file  Social History Narrative   Not on file    REVIEW OF SYSTEMS: Constitutional: No fevers, chills, or sweats, no generalized fatigue, change in appetite Eyes: No visual changes, double vision, eye pain Ear, nose and throat: No hearing loss, ear pain, nasal congestion, sore throat Cardiovascular: No chest pain, palpitations Respiratory:  No shortness of breath at rest or with exertion, wheezes GastrointestinaI: No nausea, vomiting, diarrhea, abdominal pain, fecal incontinence Genitourinary:  No dysuria, urinary retention or frequency Musculoskeletal:  No neck pain, +back pain Integumentary: No rash, pruritus, skin lesions Neurological: as above Psychiatric: No depression, insomnia, anxiety Endocrine: No palpitations, fatigue, diaphoresis, mood swings, change in appetite, change in weight, increased thirst Hematologic/Lymphatic:  No anemia, purpura, petechiae. Allergic/Immunologic: no itchy/runny eyes, nasal congestion, recent allergic reactions, rashes  PHYSICAL  EXAM: Vitals:   01/06/19 1059  BP: (!) 152/82  Pulse: 72   General: No acute distress Head:  Normocephalic/atraumatic Skin/Extremities: No rash, no edema Neurological Exam: alert and oriented to person, place, and time. No aphasia or dysarthria. Fund of knowledge is appropriate.  Recent and remote memory are intact.  Attention and concentration are normal.    Able to name objects and repeat phrases. Cranial nerves: Pupils equal, round, reactive to light. Extraocular movements intact with no nystagmus. Visual fields full. Facial sensation intact. No facial asymmetry. Tongue, uvula, palate midline.  Motor: Bulk and tone normal, muscle strength 5/5 throughout with no pronator drift. Finger to nose testing intact.  Gait: ambulates with knees bent, right food slightly turned inward with right foot shuffling on the floor with each stride, fair arm swing. No cogwheeling or tremor noted.   IMPRESSION: This is a pleasant 73 yo LH man with a history of hypertension, hyperlipidemia, who presented with stiffness and gait difficulties. He has had chronic right foot problems that have caused him to limp, but symptoms have worsened over the past few years, to the point where his right foot drags and turns in on ambulation. He ambulates with knees bent. He also reports leg stiffness and heaviness. He has had an extensive evaluation with MRI brain, cervical/thoracic/lumbar spine, EMG/NCV x 2 which have been unremarkable. He has seen 2 Movement Disorder specialists with a diagnosis of dystonia, genetic testing and dystonia labs negative. He has started Botox treatments and received 2 injections so far with not much change in symptoms. He was encouraged to continue with sessions and recommended follow-up by Dr. Wynn Banker. Resume exercise. He is unsure if the Sinemet was actually helping with leg stiffness or if it is lack of exercise worsening it, if no change with resuming exercise, he is willing to re-try Sinemet.  Follow-up in 3-4 months, they know to call for any changes.   Thank you for allowing me to participate in his care.  Please do not hesitate to call for any questions or concerns.   Patrcia Dolly, M.D.   CC: Dr. Clelia Croft, Dr. Wynn Banker

## 2019-02-09 ENCOUNTER — Encounter: Payer: Medicare Other | Attending: Physical Medicine & Rehabilitation | Admitting: Physical Medicine & Rehabilitation

## 2019-02-09 ENCOUNTER — Encounter: Payer: Self-pay | Admitting: Physical Medicine & Rehabilitation

## 2019-02-09 ENCOUNTER — Other Ambulatory Visit: Payer: Self-pay

## 2019-02-09 VITALS — BP 131/84 | HR 67 | Temp 97.3°F | Ht 68.0 in | Wt 184.0 lb

## 2019-02-09 DIAGNOSIS — R269 Unspecified abnormalities of gait and mobility: Secondary | ICD-10-CM | POA: Diagnosis not present

## 2019-02-09 DIAGNOSIS — G248 Other dystonia: Secondary | ICD-10-CM | POA: Diagnosis not present

## 2019-02-09 NOTE — Progress Notes (Signed)
Subjective:    Patient ID: Joseph Richards, male    DOB: 10-19-1945, 73 y.o.   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  These are all relatively normal.  The EMG/NCV of the lower extremities was normal as well.  Extensive blood work looking for autoimmune causes was similarly negative.  The patient has been treated with Sinemet. In addition to problems with the right foot he also notes burning pain in the thighs when he leans forward to garden for a long period of time.  States that his balance is still off.  We discussed the limitations of Botox, that it would not affect all of his problems but more specifically the abnormality in tone in the right lower extremity  RIGHT LE botox Post tib 75U FDL 75U Soleus 50U Pain Inventory Average Pain 7 Pain Right Now 0 My pain is burning  In the last 24 hours, has pain interfered with the following? General activity 7 Relation with others 3 Enjoyment of life 7 What TIME of day is your pain at its worst? evening Sleep (in general) Fair  Pain is worse with: walking and bending Pain improves with: therapy/exercise Relief from Meds: 0  Mobility use a cane do you drive?  yes  Function retired  Neuro/Psych trouble walking  Prior Studies Any changes since last visit?  no  Physicians involved in your care Any changes since last visit?  no   Family History  Problem Relation Age of Onset  . Aneurysm Mother        brain  . Lung cancer Father   . Heart attack Father   . Breast cancer Sister   . Healthy Daughter    Social History   Socioeconomic History  . Marital status: Married    Spouse name: Not on file  . Number of children: Not on file  . Years of education: Not on file  . Highest education  level: Not on file  Occupational History  . Occupation: retired    Comment: Multimedia programmer  Social Needs  . Financial resource strain: Not on file  . Food insecurity    Worry: Not on file    Inability: Not on file  . Transportation needs    Medical: Not on file    Non-medical: Not on file  Tobacco Use  . Smoking status: Former Smoker    Quit date: 09/29/1982    Years since quitting: 36.3  . Smokeless tobacco: Current User    Types: Chew  Substance and Sexual Activity  . Alcohol use: Yes    Comment: 1 drink every couple months  . Drug use: No  . Sexual activity: Yes    Partners: Female  Lifestyle  . Physical activity    Days per week: Not on file    Minutes per session: Not on file  . Stress: Not on file  Relationships  . Social Herbalist on phone: Not on file    Gets together: Not on file    Attends religious service: Not on file    Active member of club or organization: Not on file    Attends meetings of clubs or organizations: Not on file    Relationship status: Not on file  Other Topics  Concern  . Not on file  Social History Narrative   Left handed      Lives with wife   Past Surgical History:  Procedure Laterality Date  . HERNIA REPAIR     Past Medical History:  Diagnosis Date  . Dystonia   . GERD (gastroesophageal reflux disease)   . Hyperlipidemia   . Hypertension    BP 131/84   Pulse 67   Temp (!) 97.3 F (36.3 C)   Ht 5\' 8"  (1.727 m)   Wt 184 lb (83.5 kg)   SpO2 97%   BMI 27.98 kg/m   Opioid Risk Score:   Fall Risk Score:  `1  Depression screen PHQ 2/9  Depression screen PHQ 2/9 08/30/2018  Decreased Interest 0  Down, Depressed, Hopeless 0  PHQ - 2 Score 0     Review of Systems  Constitutional: Negative.   HENT: Negative.   Eyes: Negative.   Respiratory: Negative.   Cardiovascular: Negative.   Gastrointestinal: Negative.   Endocrine: Negative.   Genitourinary: Negative.   Musculoskeletal: Negative.   Skin:  Negative.   Allergic/Immunologic: Negative.   Neurological: Negative.   Hematological: Negative.   Psychiatric/Behavioral: Negative.   All other systems reviewed and are negative.      Objective:   Physical Exam General no acute distress Mood and affect appropriate Speech without dysarthria or aphasia. Movements are bradykinetic.  No evidence of freezing  Ambulates with a cane.  His foot is plantigrade during walking.  There is some clawing of the toes but this is not severe.  He is able to clear his foot although has reduced toe clearance on the right ear side compared to left side. 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     Assessment & Plan:  1.  Focal dystonia right lower extremity has had improvement in foot positioning during gait activities.  I would recommend continuing the same dosage of the Botox. We discussed that his other complaints i.e. thigh burning with prolonged bending as well as overall problems with balance are not going to be improved with the Botox injection.  He has had physical therapy in the past but not for a couple years.  We will reorder this. Patient will follow-up with neurology, trying Sinemet once again

## 2019-03-08 ENCOUNTER — Other Ambulatory Visit: Payer: Self-pay

## 2019-03-08 ENCOUNTER — Encounter: Payer: Self-pay | Admitting: Physical Therapy

## 2019-03-08 ENCOUNTER — Ambulatory Visit: Payer: Medicare Other | Attending: Physical Medicine & Rehabilitation | Admitting: Physical Therapy

## 2019-03-08 DIAGNOSIS — R2689 Other abnormalities of gait and mobility: Secondary | ICD-10-CM | POA: Insufficient documentation

## 2019-03-08 DIAGNOSIS — M6281 Muscle weakness (generalized): Secondary | ICD-10-CM | POA: Diagnosis present

## 2019-03-08 NOTE — Patient Instructions (Signed)
HEP: Power 4 for [employer] X 20 reps each, 2X a day, Ankle DF, INV, EV, with yellow 2X 10 ea

## 2019-03-08 NOTE — Therapy (Signed)
[facility] [address] [place], [place], [address] Phone: [phone]   Fax:  [phone]  Physical Therapy Evaluation  Patient Details  Name: Joseph Richards MRN: [id number] Date of Birth: [birth year] Referring Provider (PT): Joseph Banker Victorino Sparrow, Joseph Richards   Encounter Date: 03/08/2019  PT End of Session - 03/08/19 2046    Visit Number  1    Number of Visits  16    Date for PT Re-Evaluation  05/03/19    Authorization Type  [employer] MCR, progress note at 10, KX at 15    PT Start Time  1530    PT Stop Time  1620    PT Time Calculation (min)  50 min    Equipment Utilized During Treatment  Gait belt    Activity Tolerance  Patient tolerated treatment well    Behavior During Therapy  [facility] for tasks assessed/performed       Past Medical History:  Diagnosis Date  . Dystonia   . GERD (gastroesophageal reflux disease)   . Hyperlipidemia   . Hypertension     Past Surgical History:  Procedure Laterality Date  . HERNIA REPAIR      There were no vitals filed for this visit.   Subjective Assessment - 03/08/19 1539    Subjective  The patient has had an extensive work-up performed by 3 neurologists and included MRI of the brain, cervical spine, lumbar spine and thoracic spine.  These are all relatively normal.  The EMG/NCV of the lower extremities was normal as well.  Extensive blood work looking for autoimmune causes was similarly negative.  The patient has been treated with Sinemet.In addition of botox injections to his Rt foot. Relays he was been trying botox injections which helped keep his Rt foot from turning in so much, but his Rt foot still is still in plantarflexed position. He tried some PD meds but cant tell much difference. He needs relays he was having PT at the [facility] but then the insurance ran out then he was working out at [employer] but had to stop due to covid. He feels like he has now regressed with leg strenght, gait/balance  and his Rt legs feel stiff or heavy    Pertinent History  Focal dystonia Rt LE,  Neurologic gait disorder    How long can you stand comfortably?  10 min    How long can you walk comfortably?  5 min using SPC    Diagnostic tests  MRI of the brain, cervical spine, lumbar spine and thoracic spine.  These are all relatively normal. he EMG/NCV of the lower extremities was normal as well.  Extensive blood work looking for autoimmune causes was similarly negative.    Patient Stated Goals  walk better, improve balance, endurance, strength    Currently in Pain?  No/denies         [facility] PT Assessment - 03/08/19 0001      Assessment   Medical Diagnosis  Focal dystonia Rt LE,  Neurologic gait disorder    Referring Provider (PT)  Joseph Banker, Victorino Sparrow, Joseph Richards    Onset Date/Surgical Date  --   2016   Hand Dominance  Left    Next Joseph Richards Visit  03/23/2019    Prior Therapy  PT at [facility]      Precautions   Precautions  Fall      Balance Screen   Has the patient fallen in the past 6 months  Yes    How  many times?  2    Has the patient had a decrease in activity level because of a fear of falling?   Yes    Is the patient reluctant to leave their home because of a fear of falling?   No      Home Public house managernvironment   Living Environment  Private residence      Prior Function   Level of Independence  Requires assistive device for independence   needs SPC and HHA with curb/steps   Vocation  Retired    Leisure  being out in the yard      Cognition   Overall Cognitive Status  Within Functional Limits for tasks assessed      Sensation   Light Touch  Appears Intact      Coordination   Finger Nose Finger Test  WNL    Heel Shin Test  decreased ability on Rt but appears to be more due to weakness than coordination      ROM / Strength   AROM / PROM / Strength  AROM;Strength      AROM   Overall AROM   Within functional limits for tasks performed    Overall AROM Comments  except Rt ankle limited DF and EV ROM due  to weakness      Strength   Overall Strength Comments  UE WFL, Lt LE 5/5, LE, Rt LE as follows: Rt ankle 3/5, hip 4/5, knee flexion 4/5, knee ext 4+/5 all tested in sitting      Transfers   Transfers  Independent with all Transfers      Ambulation/Gait   Ambulation/Gait  Yes    Ambulation/Gait Assistance  4: Min guard    Ambulation Distance (Feet)  50 Feet    Assistive device  Straight cane    Gait velocity  20 ft in 10 sec =2 ft/sec    Gait Comments  unsteady, difficulty advancing Rt leg due to weakness and decreased hip flexion, knee flexion, and dorsiiflexion and increased inversion on his Rt foot      6 Minute Walk- Baseline   6 Minute Walk- Baseline  --   consider testing next session     Standardized Balance Assessment   Standardized Balance Assessment  Berg Balance Test;Timed Up and Go Test;Five Times Sit to Stand    Five times sit to stand comments   13.4 seconds      Berg Balance Test   Sit to Stand  Able to stand without using hands and stabilize independently    Standing Unsupported  Able to stand safely 2 minutes    Sitting with Back Unsupported but Feet Supported on Floor or Stool  Able to sit safely and securely 2 minutes    Stand to Sit  Controls descent by using hands    Transfers  Able to transfer safely, definite need of hands    Standing Unsupported with Eyes Closed  Able to stand 10 seconds with supervision    Standing Unsupported with Feet Together  Able to place feet together independently and stand for 1 minute with supervision    From Standing, Reach Forward with Outstretched Arm  Can reach forward >12 cm safely (5")    From Standing Position, Pick up Object from Floor  Able to pick up shoe, needs supervision    From Standing Position, Turn to Look Behind Over each Shoulder  Looks behind from both sides and weight shifts well    Turn 360 Degrees  Needs close  supervision or verbal cueing    Standing Unsupported, Alternately Place Feet on Step/Stool  Able to  complete >2 steps/needs minimal assist    Standing Unsupported, One Foot in [employer] balance while stepping or standing    Standing on One Leg  Tries to lift leg/unable to hold 3 seconds but remains standing independently    Total Score  37      Timed Up and Go Test   Normal TUG (seconds)  13   using SPC               Objective measurements completed on examination: See above findings.              PT Education - 03/08/19 2046    Education Details  HEP, POC, exam findings, benefits of AFO    Person(s) Educated  Patient;Spouse    Methods  Explanation;Demonstration;Verbal cues;Handout    Comprehension  Verbalized understanding;Need further instruction       PT Short Term Goals - 03/08/19 2054      PT SHORT TERM GOAL #1   Title  pt will be I and compliant with initial HEP. (Target for STG 4 weeks 04/07/19)    Status  New      PT SHORT TERM GOAL #2   Title  Pt will improve BERG balance to >40 to show improved balance    Baseline  37    Status  New        PT Long Term Goals - 03/08/19 2055      PT LONG TERM GOAL #1   Title  Pt will improve BERG balance >45 to show improved balance.    Baseline  37    Status  New      PT LONG TERM GOAL #2   Title  Pt will be able to ambulate at least 750 feet mod I with LRAD with improved Rt foot clearance.    Status  New      PT LONG TERM GOAL #3   Title  Pt will perform TUG in less than 12 sec with LRAD mod I    Status  New      PT LONG TERM GOAL #4   Title  Consider performing 6MWT for endurance and write appropriate goal.             Plan - 03/08/19 2048    Clinical Impression Statement  Pt presents with Rt leg weakness, Focal dystonia in Rt LE, and neologic gait disorder. He has overall decreased strength, decreased balance, difficulty advancing his Rt leg, Rt foot drop, and decreased endurance for standing or walking. He has had 2 recent falls. He will benefit from skilled PT to address his  deficits.    Personal Factors and Comorbidities  Comorbidity 1;Time since onset of injury/illness/exacerbation    Comorbidities  focal dystonia Rt LE since 2016    Examination-Activity Limitations  Bend;Carry;Squat;Stairs;Stand;Lift;Locomotion Level    Examination-Participation Restrictions  Meal Prep;Cleaning;Community Activity;Driving;Laundry;Yard Work    [employer]  Evolving/Moderate complexity    Clinical Decision Making  Moderate    Rehab Potential  Fair    PT Frequency  2x / week    PT Duration  8 weeks    PT Treatment/Interventions  ADLs/Self Care Home Management;Aquatic Therapy;Electrical Stimulation;DME Instruction;Gait training;Stair training;Functional mobility training;Therapeutic activities;Therapeutic exercise;Balance training;Neuromuscular re-education;Manual techniques;Orthotic Fit/Training;Passive range of motion;Dry needling    PT Next Visit Plan  review and update HEP as needed, needs to work on gait,  balance Rt leg strength paticularly hip and ankle, consider and or FGA/DGI for baseline and write goal, consider AFO in future    PT Home Exercise Plan  HEP: Power 4 for parkinsons X 20 reps each, 2X a day, Ankle DF, INV, EV, with yellow 2X 10 ea    Consulted and Agree with Plan of Care  Patient;Family member/caregiver    Family Member Consulted  wife       Patient will benefit from skilled therapeutic intervention in order to improve the following deficits and impairments:  Abnormal gait, Decreased activity tolerance, Decreased balance, Decreased coordination, Decreased range of motion, Decreased strength, Difficulty walking, Impaired flexibility, Postural dysfunction  Visit Diagnosis: Other abnormalities of gait and mobility  Muscle weakness (generalized)     Problem List Patient Active Problem List   Diagnosis Date Noted  . 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 Neuropathy 08/11/2016    April Manson  ,PT,DPT 03/08/2019, 9:00 PM  [facility] [address] [place], [place], [address] Phone: [phone]   Fax:  [phone]  Name: Joseph Richards MRN: [id number] Date of Birth: [birth year]

## 2019-03-13 ENCOUNTER — Ambulatory Visit: Payer: Medicare Other | Attending: Physical Medicine & Rehabilitation | Admitting: Physical Therapy

## 2019-03-13 ENCOUNTER — Other Ambulatory Visit: Payer: Self-pay

## 2019-03-13 DIAGNOSIS — M6281 Muscle weakness (generalized): Secondary | ICD-10-CM

## 2019-03-13 DIAGNOSIS — R2689 Other abnormalities of gait and mobility: Secondary | ICD-10-CM | POA: Insufficient documentation
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
## 2019-03-15 ENCOUNTER — Ambulatory Visit: Payer: Medicare Other | Admitting: Physical Therapy

## 2019-03-17 ENCOUNTER — Encounter: Payer: Self-pay | Admitting: Physical Therapy

## 2019-03-17 ENCOUNTER — Ambulatory Visit: Payer: Medicare Other | Admitting: Physical Therapy

## 2019-03-17 ENCOUNTER — Other Ambulatory Visit: Payer: Self-pay

## 2019-03-17 DIAGNOSIS — M6281 Muscle weakness (generalized): Secondary | ICD-10-CM

## 2019-03-17 DIAGNOSIS — R2689 Other abnormalities of gait and mobility: Secondary | ICD-10-CM | POA: Diagnosis not present

## 2019-03-17 NOTE — Therapy (Signed)
[facility] [address] [place], [place], [address] Phone: [phone]   Fax:  [phone]  Physical Therapy Treatment  Patient Details  Name: Joseph Richards MRN: [id number] Date of Birth: 01/12/1946 Referring Provider (PT): Wynn Banker Victorino Sparrow, MD   Encounter Date: 03/17/2019  PT End of Session - 03/17/19 1125    Visit Number  3    Number of Visits  16    Date for PT Re-Evaluation  05/03/19    Authorization Type  [employer] MCR, progress note at 10, KX at 15    PT Start Time  0936    PT Stop Time  1019    PT Time Calculation (min)  43 min    Equipment Utilized During Treatment  Gait belt    Activity Tolerance  Patient tolerated treatment well    Behavior During Therapy  [facility] for tasks assessed/performed       Past Medical History:  Diagnosis Date  . Dystonia   . GERD (gastroesophageal reflux disease)   . Hyperlipidemia   . Hypertension     Past Surgical History:  Procedure Laterality Date  . HERNIA REPAIR      There were no vitals filed for this visit.  Subjective Assessment - 03/17/19 0939    Subjective  Yesterday was having trouble because I felt like my legs and feet were heavy.  To go back for Botox next week.  Have a stationary bike at home and it's hard to step over to get onto it.    Pertinent History  Focal dystonia Rt LE,  Neurologic gait disorder    How long can you stand comfortably?  10 min    How long can you walk comfortably?  5 min using SPC    Diagnostic tests  MRI of the brain, cervical spine, lumbar spine and thoracic spine.  These are all relatively normal. he EMG/NCV of the lower extremities was normal as well.  Extensive blood work looking for autoimmune causes was similarly negative.    Patient Stated Goals  walk better, improve balance, endurance, strength    Currently in Pain?  No/denies                       [facility] Adult PT Treatment/Exercise - 03/17/19 0001       Ambulation/Gait   Ambulation/Gait  Yes    Ambulation/Gait Assistance  4: Min guard;5: Supervision    Ambulation/Gait Assistance Details  Pt ambulates into and out of gym with cane; no device mat<>counter for standing exercises    Ambulation Distance (Feet)  80 Feet   15 ft x 2 no device   Assistive device  Straight cane    Gait Pattern  Step-through pattern;Decreased step length - right;Decreased step length - left;Right flexed knee in stance;Left flexed knee in stance;Trunk flexed;Poor foot clearance - left;Poor foot clearance - right    Ambulation Surface  Level;Unlevel      Exercises   Exercises  Other Exercises;Knee/Hip    Other Exercises   --      Knee/Hip Exercises: Stretches   Active Hamstring Stretch  5 reps;10 seconds    Active Hamstring Stretch Limitations  Standing at counter, rocking forward/back through hips.  Pt has good form, but reports not feeling stretch.      Knee/Hip Exercises: Standing   Hip Flexion  Right;Left;1 set;10 reps;Knee bent    Hip Flexion Limitations  Cues for hight step march, for wide foot placement  Other Standing Knee Exercises  Side step and weightshift x 10 reps each side, then added obstacle for increased step height, x 10 reps each side, then side step/together, over obstacle x 5 reps.  Hamstring curl 10 reps each leg, with assist, as pt has difficulty with full hamstring curl    Other Standing Knee Exercises  Standing PWR! Up (minisquat then into full upright position); cues full terminal knee extension and glut activation for more upright posture (pt not able to fully achieve upright thorugh hips, quads).  Standing wide BOS lateral weightshifting and reaching, x 10 reps each side (pt leans forward onto counter).        Knee/Hip Exercises: Seated   Other Seated Knee/Hip Exercises  Seated marching x 10 reps, cues for increased height, then side step marching out and in, 2 sets x 10, with focus on foot clearance, step height.    Hamstring Curl   Strengthening;Right;Left;2 sets;10 reps   Red band for resistance     Knee/Hip Exercises: Supine   Bridges  Strengthening;Both;1 set;10 reps   Cues for positioning of feet            PT Education - 03/17/19 1124    Education Details  Added to HEP-see instructions; discussed optional ways to get onto and off of stationary bike at home; exercises for big effort for leg lifting to clear middle of bike to get on.    Person(s) Educated  Patient;Spouse    Methods  Explanation;Demonstration;Handout;Verbal cues    Comprehension  Verbalized understanding;Returned demonstration       PT Short Term Goals - 03/08/19 2054      PT SHORT TERM GOAL #1   Title  pt will be I and compliant with initial HEP. (Target for STG 4 weeks 04/07/19)    Status  New      PT SHORT TERM GOAL #2   Title  Pt will improve BERG balance to >40 to show improved balance    Baseline  37    Status  New        PT Long Term Goals - 03/08/19 2055      PT LONG TERM GOAL #1   Title  Pt will improve BERG balance >45 to show improved balance.    Baseline  37    Status  New      PT LONG TERM GOAL #2   Title  Pt will be able to ambulate at least 750 feet mod I with LRAD with improved Rt foot clearance.    Status  New      PT LONG TERM GOAL #3   Title  Pt will perform TUG in less than 12 sec with LRAD mod I    Status  New      PT LONG TERM GOAL #4   Title  Consider performing 6MWT for endurance and write appropriate goal.            Plan - 03/17/19 1125    Clinical Impression Statement  Focused PT session today on lower extremity strengthening in supine, sitting, and standing positions.  Functionally, worked on exercises to help pt clear foot onto and off of his stationary bike.  In standing, he has difficulty with full knee/hip extension, so exercises added to HEP to address.    Personal Factors and Comorbidities  Comorbidity 1;Time since onset of injury/illness/exacerbation    Comorbidities  focal  dystonia Rt LE since 2016    Examination-Activity Limitations  Bend;Carry;Squat;Stairs;Stand;Lift;Locomotion Level  Examination-Participation Restrictions  Meal Prep;Cleaning;Community Activity;Driving;Laundry;Yard Work    [employer]chant [employer] officer  Evolving/Moderate complexity    Rehab Potential  Fair    PT Frequency  2x / week    PT Duration  8 weeks    PT Treatment/Interventions  ADLs/Self Care Home Management;Aquatic Therapy;Electrical Stimulation;DME Instruction;Gait training;Stair training;Functional mobility training;Therapeutic activities;Therapeutic exercise;Balance training;Neuromuscular re-education;Manual techniques;Orthotic Fit/Training;Passive range of motion;Dry needling    PT Next Visit Plan  review and update HEP updates; trunk, hip, knee strengthening (?try tall kneeling positions or quadruped); needs to work on gait, balance Rt leg strength paticularly hip and ankle, consider AFO in future?    PT Home Exercise Plan  HEP: Power 4 for parkinsons X 20 reps each, 2X a day, Ankle DF, INV, EV, with yellow 2X 10 ea    Consulted and Agree with Plan of Care  Patient;Family member/caregiver    Family Member Consulted  wife       Patient will benefit from skilled therapeutic intervention in order to improve the following deficits and impairments:  Abnormal gait, Decreased activity tolerance, Decreased balance, Decreased coordination, Decreased range of motion, Decreased strength, Difficulty walking, Impaired flexibility, Postural dysfunction  Visit Diagnosis: Muscle weakness (generalized)     Problem List Patient Active Problem List   Diagnosis Date Noted  . 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 Neuropathy 08/11/2016    Ilhan Madan W. 03/17/2019, 11:28 AM  Frazier Butt., PT   [place] [address] [place], [place], [address] Phone: [phone]   Fax:   [phone]  Name: ZIYAN SCHOON MRN: [id number] Date of Birth: [birth year]

## 2019-03-17 NOTE — Patient Instructions (Signed)
Access Code: [email or web address]  URL: [email or web address]  Date: 03/17/2019  Prepared by: Mady Haagensen   Exercises Seated Hamstring Curl with Anchored Resistance - 10 reps - 2 sets - 1x daily - 5x weekly Seated Hamstring Stretch - 3 reps - 1 sets - 30 sec hold - 1x daily - 5x weekly Supine Bridge - 10 reps - 1-2 sets - 1x daily - 5x weekly

## 2019-03-20 ENCOUNTER — Ambulatory Visit: Payer: Medicare Other | Admitting: Physical Therapy

## 2019-03-23 ENCOUNTER — Encounter: Payer: Self-pay | Admitting: Physical Medicine & Rehabilitation

## 2019-03-23 ENCOUNTER — Encounter: Payer: Medicare Other | Attending: Physical Medicine & Rehabilitation | Admitting: Physical Medicine & Rehabilitation

## 2019-03-23 ENCOUNTER — Other Ambulatory Visit: Payer: Self-pay

## 2019-03-23 VITALS — BP 133/80 | HR 65 | Temp 97.9°F | Ht 68.0 in | Wt 185.0 lb

## 2019-03-23 DIAGNOSIS — G248 Other dystonia: Secondary | ICD-10-CM

## 2019-03-23 NOTE — Progress Notes (Signed)
Botox Injection for spasticity using needle EMG guidance  Dilution: 50 Units/ml Indication: Severe spasticity which interferes with ADL,mobility and/or  hygiene and is unresponsive to medication management and other conservative care Informed consent was obtained after describing risks and benefits of the procedure with the patient. This includes bleeding, bruising, infection, excessive weakness, or medication side effects. A REMS form is on file and signed. Needle: 25g 2" needle electrode Number of units per muscle RIGHT Post tib 75U FDL 75U Lateral gastroc 50U All injections were done after obtaining appropriate EMG activity and after negative drawback for blood. The patient tolerated the procedure well. Post procedure instructions were given. A followup appointment was made.

## 2019-03-24 ENCOUNTER — Ambulatory Visit: Payer: Medicare Other

## 2019-03-24 ENCOUNTER — Other Ambulatory Visit: Payer: Self-pay

## 2019-03-24 DIAGNOSIS — R2689 Other abnormalities of gait and mobility: Secondary | ICD-10-CM

## 2019-03-24 DIAGNOSIS — M6281 Muscle weakness (generalized): Secondary | ICD-10-CM

## 2019-03-24 NOTE — Therapy (Signed)
[facility] [address] [place], [place], [address] Phone: [phone]   Fax:  [phone]  Physical Therapy Treatment  Patient Details  Name: CURRIE DENNIN MRN: [id number] Date of Birth: July 29, 1945 Referring Provider (PT): Letta Pate Luanna Salk, MD   Encounter Date: 03/24/2019  PT End of Session - 03/24/19 0940    Visit Number  4    Number of Visits  16    Date for PT Re-Evaluation  05/03/19    Authorization Type  [employer] MCR, progress note at 40, KX at 15    PT Start Time  0935    PT Stop Time  1015    PT Time Calculation (min)  40 min    Equipment Utilized During Treatment  Gait belt    Activity Tolerance  Patient tolerated treatment well    Behavior During Therapy  [facility] for tasks assessed/performed       Past Medical History:  Diagnosis Date  . Dystonia   . GERD (gastroesophageal reflux disease)   . Hyperlipidemia   . Hypertension     Past Surgical History:  Procedure Laterality Date  . HERNIA REPAIR      There were no vitals filed for this visit.  Subjective Assessment - 03/24/19 0940    Subjective  Pt reports that he got botox injections yesterday by Dr. Letta Pate on right leg in gastroc and inverters. Reports he has been working on [employer].    Pertinent History  Focal dystonia Rt LE,  Neurologic gait disorder    How long can you stand comfortably?  10 min    How long can you walk comfortably?  5 min using SPC    Diagnostic tests  MRI of the brain, cervical spine, lumbar spine and thoracic spine.  These are all relatively normal. he EMG/NCV of the lower extremities was normal as well.  Extensive blood work looking for autoimmune causes was similarly negative.    Patient Stated Goals  walk better, improve balance, endurance, strength    Currently in Pain?  No/denies                       [facility] Adult PT Treatment/Exercise - 03/24/19 1001      Ambulation/Gait   Ambulation/Gait  Yes    Ambulation/Gait Assistance  5: Supervision    Ambulation/Gait Assistance Details  Pt ambulates into and out of gym with cane; no device mat<>counter for standing exercises    Ambulation Distance (Feet)  80 Feet   15'x2   Assistive device  Straight cane    Gait Pattern  Step-through pattern;Decreased step length - right;Decreased step length - left;Decreased dorsiflexion - right;Decreased dorsiflexion - left   right foot inverted   Ambulation Surface  Level;Indoor      Neuro Re-ed    Neuro Re-ed Details   Sidestepping along bar with only occasional UE support 6' x4, marching with finger tip support on 1 UE support 6' x 4 then marching forwad and backwards step 6' x 4 each CGA for safety.  Standing with feet together without UE support x 30 sec then with reaching across body x 10 each side. Standing on airex feet apart eyes open x 30 sec then x 30 sec eyes closed. Increased sway with eyes closed.  Reciprocal steps over 2 yard sticks and 2 2" foam beams with hand held support x 2 laps then with SPC x 2 laps CGA. Pt was given cues to try to  get more heel strike.      Exercises   Exercises  Other Exercises    Other Exercises   Wall bumps x 10 with verbal cues for form.              PT Education - 03/24/19 1605    Education Details  Pt to continue with current HEP    Person(s) Educated  Patient    Methods  Explanation    Comprehension  Verbalized understanding       PT Short Term Goals - 03/08/19 2054      PT SHORT TERM GOAL #1   Title  pt will be I and compliant with initial HEP. (Target for STG 4 weeks 04/07/19)    Status  New      PT SHORT TERM GOAL #2   Title  Pt will improve BERG balance to >40 to show improved balance    Baseline  37    Status  New        PT Long Term Goals - 03/08/19 2055      PT LONG TERM GOAL #1   Title  Pt will improve BERG balance >45 to show improved balance.    Baseline  37    Status  New      PT LONG TERM GOAL #2   Title  Pt will be able  to ambulate at least 750 feet mod I with LRAD with improved Rt foot clearance.    Status  New      PT LONG TERM GOAL #3   Title  Pt will perform TUG in less than 12 sec with LRAD mod I    Status  New      PT LONG TERM GOAL #4   Title  Consider performing for endurance and write appropriate goal.            Plan - 03/24/19 1605    Clinical Impression Statement  Pt was able to increase foot clearance with stepping over obstacles today. More upright posture with standing today but does exhibit more flexion as fatigues or when unsteady with activity.    Personal Factors and Comorbidities  Comorbidity 1;Time since onset of injury/illness/exacerbation    Comorbidities  focal dystonia Rt LE since 2016    Examination-Activity Limitations  Bend;Carry;Squat;Stairs;Stand;Lift;Locomotion Level    Examination-Participation Restrictions  Meal Prep;Cleaning;Community Activity;Driving;Laundry;Yard Work    Conservation officer, historic buildings  Evolving/Moderate complexity    Rehab Potential  Fair    PT Frequency  2x / week    PT Duration  8 weeks    PT Treatment/Interventions  ADLs/Self Care Home Management;Aquatic Therapy;Electrical Stimulation;DME Instruction;Gait training;Stair training;Functional mobility training;Therapeutic activities;Therapeutic exercise;Balance training;Neuromuscular re-education;Manual techniques;Orthotic Fit/Training;Passive range of motion;Dry needling    PT Next Visit Plan  review and update HEP; trunk, hip, knee strengthening (?try tall kneeling positions or quadruped); needs to work on gait, balance Rt leg strength paticularly hip and ankle, consider AFO in future?    PT Home Exercise Plan  HEP: Power 4 for parkinsons X 20 reps each, 2X a day, Ankle DF, INV, EV, with yellow 2X 10 ea    Consulted and Agree with Plan of Care  Patient;Family member/caregiver    Family Member Consulted  wife       Patient will benefit from skilled therapeutic intervention in order to  improve the following deficits and impairments:  Abnormal gait, Decreased activity tolerance, Decreased balance, Decreased coordination, Decreased range of motion, Decreased strength, Difficulty walking, Impaired flexibility,  Postural dysfunction  Visit Diagnosis: Muscle weakness (generalized)  Other abnormalities of gait and mobility     Problem List Patient Active Problem List   Diagnosis Date Noted  . 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 Neuropathy 08/11/2016    Ronn MelenaEmily A Lazette Estala, PT, DPT, NCS 03/24/2019, 4:09 PM  [facility] [address] [place], [place], [address] Phone: [phone]   Fax:  [phone]  Name: Arn MedalRalph B Califano MRN: [id number] Date of Birth: April 17, 1946

## 2019-03-29 ENCOUNTER — Encounter: Payer: Self-pay | Admitting: Physical Therapy

## 2019-03-29 ENCOUNTER — Other Ambulatory Visit: Payer: Self-pay

## 2019-03-29 ENCOUNTER — Ambulatory Visit: Payer: Medicare Other | Admitting: Physical Therapy

## 2019-03-29 DIAGNOSIS — R2689 Other abnormalities of gait and mobility: Secondary | ICD-10-CM

## 2019-03-29 DIAGNOSIS — M6281 Muscle weakness (generalized): Secondary | ICD-10-CM

## 2019-03-29 NOTE — Patient Instructions (Addendum)
Access Code: [email or web address]  URL: [email or web address]  Date: 03/29/2019  Prepared by: Mady Haagensen   Exercises Seated Hamstring Curl with Anchored Resistance - 10 reps - 2 sets - 1x daily - 5x weekly Seated Hamstring Stretch - 3 reps - 1 sets - 30 sec hold - 1x daily - 5x weekly Supine Bridge - 10 reps - 1-2 sets - 1x daily - 5x weekly  Added 03/29/2019: Seated Toe Raise - 10 reps - 2 sets - 1x daily - 5x weekly Long Sitting Isometric Ankle Eversion in Dorsiflexion with Ball at Twining - 10 reps - 2 sets - 1x daily - 5x weekly Standing Gastroc Stretch - 1 reps - 1 sets - 30 sec hold - 1x daily - 5x weekly

## 2019-03-30 NOTE — Therapy (Signed)
[facility] [address] [place], [place], [address] Phone: [phone]   Fax:  [phone]  Physical Therapy Treatment  Patient Details  Name: Joseph MedalRalph B Richards MRN: [id number] Date of Birth: 1945/05/26 Referring Provider (PT): Wynn BankerKirsteins, Victorino SparrowAndrew E, MD   Encounter Date: 03/29/2019  PT End of Session - 03/30/19 0956    Visit Number  5    Number of Visits  16    Date for PT Re-Evaluation  05/03/19    Authorization Type  [employer] MCR, progress note at 10, KX at 15    PT Start Time  1532    PT Stop Time  1618    PT Time Calculation (min)  46 min    Equipment Utilized During Treatment  Gait belt    Activity Tolerance  Patient tolerated treatment well   Pt has multiple questions, concerns about Botox   Behavior During Therapy  [facility] for tasks assessed/performed       Past Medical History:  Diagnosis Date  . Dystonia   . GERD (gastroesophageal reflux disease)   . Hyperlipidemia   . Hypertension     Past Surgical History:  Procedure Laterality Date  . HERNIA REPAIR      There were no vitals filed for this visit.  Subjective Assessment - 03/29/19 1535    Subjective  Got the 3 injections on Thursday last week; not sure that I'm seeing any difference.  This was my 3rd set of injections.    Pertinent History  Focal dystonia Rt LE,  Neurologic gait disorder    How long can you stand comfortably?  10 min    How long can you walk comfortably?  5 min using SPC    Diagnostic tests  MRI of the brain, cervical spine, lumbar spine and thoracic spine.  These are all relatively normal. he EMG/NCV of the lower extremities was normal as well.  Extensive blood work looking for autoimmune causes was similarly negative.    Patient Stated Goals  walk better, improve balance, endurance, strength    Currently in Pain?  No/denies                       [facility]/Exercise - 03/30/19 0942      Self-Care   Self-Care  Other Self-Care Comments    Other Self-Care Comments   Given pt's c/o that he doesn't feel any benefits from Botox, explained mechanism of Botox (weakening muscles that are overpowering-in his case evertors, plantarflexors), and therapy's role post-Botox is to stretch and strengthen opposite muscles for more overall balanced muscle activity (therefore, our work today on eversion and dorsiflexion).  Discussed that this would come over time, not necessary something that improves overnight.  Discussed possibility of using foot-up brace, to assist with foot clearance, dorsiflexion on RLE.      Exercises   Exercises  Ankle;Knee/Hip    Other Exercises   Standing at counter, wide BOS lateral weightshifting x 10 reps, then lateral weigthshift with added single UE reach x 5 reps each side, then weigthshfit and lift single leg (like wide based hamstring curl), x 5 reps each side, with UE support.  Short distance gait, x 10 ft, counter>mat, with cues to maintain upright posture through gluts/quads (pt continues to have decreased R foot clearance).      Knee/Hip Exercises: Stretches   Active Hamstring Stretch  Right;Left;3 reps;30 seconds    Active Hamstring Stretch Limitations  seated edge of  mat, utilized 4" block to prop foot; pt needs cues for correct technique    Gastroc Stretch  Right;Left;3 reps;30 seconds    Gastroc Stretch Limitations  Runner's stretch position at counter, cues for correct form and technique    Other Knee/Hip Stretches  Seated gastroc stretch using belt, 2 reps x 10 seconds each leg, with cues for correct technique; pt has difficulty feeling stretch    Other Knee/Hip Stretches  Attempted gastroc stretch in standing, propping R foot at 4" cabinet shelft, with pt having difficulty getting proper form.      Knee/Hip Exercises: Standing   Functional Squat  1 set;10 reps    Functional Squat Limitations  Cues each time upon standing for glut/quad activation for most upright  posture.      Ankle Exercises: Seated   Towel Inversion/Eversion  2 reps;Limitations;Other (comment)   Attempted, but pt has difficulty with eversion with towel   Toe Raise  10 reps;3 seconds   2 sets (no resistance   Other Seated Ankle Exercises  Isometric R ankle eversion agains 4" block (PT holding stable), x 10 reps, 2 sets.  PT provides tactile cues  at knees to prevent compensation of hip external rotation.  VCs to return foot to neutral and avoid going into inversion each time.              PT Education - 03/30/19 0955    Education Details  Updates to HEP, therapy's role post Botox, possibility of using foot-up brace next visit    Person(s) Educated  Patient;Spouse    Methods  Explanation;Demonstration;Verbal cues;Handout    Comprehension  Verbalized understanding;Returned demonstration;Verbal cues required;Need further instruction       PT Short Term Goals - 03/08/19 2054      PT SHORT TERM GOAL #1   Title  pt will be I and compliant with initial HEP. (Target for STG 4 weeks 04/07/19)    Status  New      PT SHORT TERM GOAL #2   Title  Pt will improve BERG balance to >40 to show improved balance    Baseline  37    Status  New        PT Long Term Goals - 03/08/19 2055      PT LONG TERM GOAL #1   Title  Pt will improve BERG balance >45 to show improved balance.    Baseline  37    Status  New      PT LONG TERM GOAL #2   Title  Pt will be able to ambulate at least 750 feet mod I with LRAD with improved Rt foot clearance.    Status  New      PT LONG TERM GOAL #3   Title  Pt will perform TUG in less than 12 sec with LRAD mod I    Status  New      PT LONG TERM GOAL #4   Title  Consider performing 6MWT for endurance and write appropriate goal.            Plan - 03/30/19 0957    Clinical Impression Statement  Addressed lower extremity flexibility and strengthening, with pt approximately 1 week post-Botox to gastrocs and invertors.  Pt with multiple  questions today, and PT attempted to explain benefits of stretching and stengthening for optimal muscle balance post Botox.  He is able to achieve more upright posture in standing with cues for quad and glut activation, but with dynamic weightshifting  activities, becomes more flexed and relies on UE support at counter.    Personal Factors and Comorbidities  Comorbidity 1;Time since onset of injury/illness/exacerbation    Comorbidities  focal dystonia Rt LE since 2016    Examination-Activity Limitations  Bend;Carry;Squat;Stairs;Stand;Lift;Locomotion Level    Examination-Participation Restrictions  Meal Prep;Cleaning;Community Activity;Driving;Laundry;Yard Work    Conservation officer, historic buildings  Evolving/Moderate complexity    Rehab Potential  Fair    PT Frequency  2x / week    PT Duration  8 weeks    PT Treatment/Interventions  ADLs/Self Care Home Management;Aquatic Therapy;Electrical Stimulation;DME Instruction;Gait training;Stair training;Functional mobility training;Therapeutic activities;Therapeutic exercise;Balance training;Neuromuscular re-education;Manual techniques;Orthotic Fit/Training;Passive range of motion;Dry needling    PT Next Visit Plan  Review updates to HEP provided 11/18; try foot-up brace on RLE to see if that helps with foot clearance with gait.  Need to check STGs next week    PT Home Exercise Plan  HEP: Power 4 for parkinsons X 20 reps each, 2X a day, Ankle DF, INV, EV, with yellow 2X 10 ea    Consulted and Agree with Plan of Care  Patient;Family member/caregiver    Family Member Consulted  wife       Patient will benefit from skilled therapeutic intervention in order to improve the following deficits and impairments:  Abnormal gait, Decreased activity tolerance, Decreased balance, Decreased coordination, Decreased range of motion, Decreased strength, Difficulty walking, Impaired flexibility, Postural dysfunction  Visit Diagnosis: Muscle weakness (generalized)  Other  abnormalities of gait and mobility     Problem List Patient Active Problem List   Diagnosis Date Noted  . 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 Neuropathy 08/11/2016    Ciin Brazzel W. 03/30/2019, 10:00 AM Gean Maidens., PT  [facility] [address] [place], [place], [address] Phone: [phone]   Fax:  [phone]  Name: RHONE OZAKI MRN: [id number] Date of Birth: Dec 29, 1945

## 2019-03-31 ENCOUNTER — Ambulatory Visit: Payer: Medicare Other | Admitting: Physical Therapy

## 2019-03-31 ENCOUNTER — Other Ambulatory Visit: Payer: Self-pay

## 2019-03-31 ENCOUNTER — Encounter: Payer: Self-pay | Admitting: Physical Therapy

## 2019-03-31 DIAGNOSIS — R2689 Other abnormalities of gait and mobility: Secondary | ICD-10-CM | POA: Diagnosis not present

## 2019-03-31 DIAGNOSIS — M6281 Muscle weakness (generalized): Secondary | ICD-10-CM

## 2019-04-01 NOTE — Therapy (Signed)
[facility] [address] [place], [place], [address] Phone: [phone]   Fax:  [phone]  Physical Therapy Treatment  Patient Details  Name: Joseph Richards MRN: [id number] Date of Birth: 1945/12/06 Referring Provider (PT): Wynn BankerKirsteins, Victorino SparrowAndrew E, MD   Encounter Date: 03/31/2019  PT End of Session - 04/01/19 0925    Visit Number  6    Number of Visits  16    Date for PT Re-Evaluation  05/03/19    Authorization Type  [employer] MCR, progress note at 10, KX at 15    PT Start Time  0933    PT Stop Time  1013    PT Time Calculation (min)  40 min    Equipment Utilized During Treatment  Gait belt    Activity Tolerance  Patient tolerated treatment well    Behavior During Therapy  [facility] for tasks assessed/performed       Past Medical History:  Diagnosis Date  . Dystonia   . GERD (gastroesophageal reflux disease)   . Hyperlipidemia   . Hypertension     Past Surgical History:  Procedure Laterality Date  . HERNIA REPAIR      There were no vitals filed for this visit.  Subjective Assessment - 03/31/19 0935    Subjective  Having a little muscle soreness; been working on the exercises.    Pertinent History  Focal dystonia Rt LE,  Neurologic gait disorder    How long can you stand comfortably?  10 min    How long can you walk comfortably?  5 min using SPC    Diagnostic tests  MRI of the brain, cervical spine, lumbar spine and thoracic spine.  These are all relatively normal. he EMG/NCV of the lower extremities was normal as well.  Extensive blood work looking for autoimmune causes was similarly negative.    Patient Stated Goals  walk better, improve balance, endurance, strength    Currently in Pain?  No/denies   soreness                      [facility] Adult PT Treatment/Exercise - 04/01/19 0916      Ambulation/Gait   Ambulation/Gait  Yes    Ambulation/Gait Assistance  5: Supervision    Ambulation/Gait  Assistance Details  Gait training trials with foot up brace on RLE.  Pt notes immediate improvement in foot clearance; PT provides cues for deliberate hipknee flexion for improved swing phase and for heelstrike.     Ambulation Distance (Feet)  230 Feet   100; then 115 ft all with foot-up brace; 50 ft no brace   Assistive device  Straight cane    Gait Pattern  Step-through pattern;Decreased step length - right;Decreased step length - left;Decreased dorsiflexion - right;Decreased dorsiflexion - left    Gait Comments  Trial of foot-up brace during PT session; pt and wife note improvement in foot clearance; still needs cues for deliberate foot clearance and heelstrike.      Knee/Hip Exercises: Standing   Hip Flexion  Right;Left;10 reps;Knee bent;2 sets    Hip Flexion Limitations  Cues for hight step march, for wide foot placement    Functional Squat  1 set;10 reps    Functional Squat Limitations  Cues for upright posture-quad and glut activation    Other Standing Knee Exercises  Side step and weigthshift x 10 reps-cues for step height and weigthshift; forward step and weigthshift x 10 reps-cues for initial swing; back step and  weigthshift x 10 reps, cues for brief gastroc stretch on anterior foot; then forward/back step and weigthshift, x 10 reps each, with cues for foot clearance/heelstrike    Other Standing Knee Exercises  Review of previously provided gastroc/hamstring stretches, with pt return demo understanding.      Between standing exercises and upon sit<>stand throughout session, PT provides cues for pt for upright posture, glut/quad activation in standing.       PT Education - 04/01/19 0924    Education Details  Benefits of foot-up brace; how to order/obtain online if pt and wife choose to pursue Environmental manager website and [employer]); pt would need Size L    Person(s) Educated  Patient;Spouse    Methods  Explanation;Demonstration   wife is taking notes on her phone   Comprehension  Verbalized  understanding       PT Short Term Goals - 03/08/19 2054      PT SHORT TERM GOAL #1   Title  pt will be I and compliant with initial HEP. (Target for STG 4 weeks 04/07/19)    Status  New      PT SHORT TERM GOAL #2   Title  Pt will improve BERG balance to >40 to show improved balance    Baseline  37    Status  New        PT Long Term Goals - 03/08/19 2055      PT LONG TERM GOAL #1   Title  Pt will improve BERG balance >45 to show improved balance.    Baseline  37    Status  New      PT LONG TERM GOAL #2   Title  Pt will be able to ambulate at least 750 feet mod I with LRAD with improved Rt foot clearance.    Status  New      PT LONG TERM GOAL #3   Title  Pt will perform TUG in less than 12 sec with LRAD mod I    Status  New      PT LONG TERM GOAL #4   Title  Consider performing 6MWT for endurance and write appropriate goal.            Plan - 04/01/19 0925    Clinical Impression Statement  Trial of foot up brace today, with noted improvement of RLE foot clearance.  Pt does still need cues for slowed pace, upright posture in order to initiate improved swing phase through hip flexion, cues for deliberate heelstrike.  Cues throughout session for more upright posture; when pt able to focus on upright posture, his overall gait pattern is improved.    Personal Factors and Comorbidities  Comorbidity 1;Time since onset of injury/illness/exacerbation    Comorbidities  focal dystonia Rt LE since 2016    Examination-Activity Limitations  Bend;Carry;Squat;Stairs;Stand;Lift;Locomotion Level    Examination-Participation Restrictions  Meal Prep;Cleaning;Community Activity;Driving;Laundry;Yard Work    Merchant navy officer  Evolving/Moderate complexity    Rehab Potential  Fair    PT Frequency  2x / week    PT Duration  8 weeks    PT Treatment/Interventions  ADLs/Self Care Home Management;Aquatic Therapy;Electrical Stimulation;DME Instruction;Gait training;Stair  training;Functional mobility training;Therapeutic activities;Therapeutic exercise;Balance training;Neuromuscular re-education;Manual techniques;Orthotic Fit/Training;Passive range of motion;Dry needling    PT Next Visit Plan  Trial again of R foot up brace )are they planning to order?); check STGs; additional exercises to work on step/weightshift emphasizing swing phase, foot clearance, heelstrike    PT Home Exercise Plan  HEP: Power  4 for parkinsons X 20 reps each, 2X a day, Ankle DF, INV, EV, with yellow 2X 10 ea    Consulted and Agree with Plan of Care  Patient;Family member/caregiver    Family Member Consulted  wife       Patient will benefit from skilled therapeutic intervention in order to improve the following deficits and impairments:  Abnormal gait, Decreased activity tolerance, Decreased balance, Decreased coordination, Decreased range of motion, Decreased strength, Difficulty walking, Impaired flexibility, Postural dysfunction  Visit Diagnosis: Other abnormalities of gait and mobility  Muscle weakness (generalized)     Problem List Patient Active Problem List   Diagnosis Date Noted  . 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 Neuropathy 08/11/2016    Haston Casebolt W. 04/01/2019, 9:32 AM Gean Maidens., PT  [facility] [address] [place], [place], [address] Phone: [phone]   Fax:  [phone]  Name: Joseph Richards MRN: [id number] Date of Birth: [birth year]

## 2019-04-04 ENCOUNTER — Ambulatory Visit: Payer: Medicare Other

## 2019-04-04 ENCOUNTER — Other Ambulatory Visit: Payer: Self-pay

## 2019-04-04 DIAGNOSIS — M6281 Muscle weakness (generalized): Secondary | ICD-10-CM

## 2019-04-04 DIAGNOSIS — R2689 Other abnormalities of gait and mobility: Secondary | ICD-10-CM | POA: Diagnosis not present

## 2019-04-04 NOTE — Patient Instructions (Signed)
Access Code: [email or web address]  URL: [email or web address]  Date: 04/04/2019  Prepared by: Cherly Anderson   Exercises Seated Hamstring Curl with Anchored Resistance - 10 reps - 2 sets - 1x daily - 5x weekly Seated Hamstring Stretch - 3 reps - 1 sets - 30 sec hold - 1x daily - 5x weekly Supine Bridge - 10 reps - 1-2 sets - 1x daily - 5x weekly Seated Toe Raise - 10 reps - 2 sets - 1x daily - 5x weekly Long Sitting Isometric Ankle Eversion in Dorsiflexion with Ball at [place] - 10 reps - 2 sets - 1x daily - 5x weekly Standing Gastroc Stretch - 1 reps - 1 sets - 30 sec hold - 1x daily - 5x weekly  Added 04/04/2019 Clamshell - 10 reps - 2 sets - 2x daily - 5x weekly

## 2019-04-04 NOTE — Therapy (Signed)
[facility] [address] [place], [place], [address] Phone: [phone]   Fax:  [phone]  Physical Therapy Treatment  Patient Details  Name: Joseph Richards MRN: [id number] Date of Birth: [birth year] Referring Provider (PT): Wynn BankerKirsteins, Victorino SparrowAndrew E, MD   Encounter Date: 04/04/2019  PT End of Session - 04/04/19 0938    Visit Number  7    Number of Visits  16    Date for PT Re-Evaluation  05/03/19    Authorization Type  UHC MCR, progress note at 10, KX at 15    PT Start Time  0935    PT Stop Time  1015    PT Time Calculation (min)  40 min    Equipment Utilized During Treatment  Gait belt    Activity Tolerance  Patient tolerated treatment well    Behavior During Therapy  [facility] for tasks assessed/performed       Past Medical History:  Diagnosis Date  . Dystonia   . GERD (gastroesophageal reflux disease)   . Hyperlipidemia   . Hypertension     Past Surgical History:  Procedure Laterality Date  . HERNIA REPAIR      There were no vitals filed for this visit.  Subjective Assessment - 04/04/19 0938    Subjective  Pt reports he is doing well. Felt that kept lifting toes better after removing brace last visit. Still taking SPC when goes out of house.    Pertinent History  Focal dystonia Rt LE,  Neurologic gait disorder    How long can you stand comfortably?  10 min    How long can you walk comfortably?  5 min using SPC    Diagnostic tests  MRI of the brain, cervical spine, lumbar spine and thoracic spine.  These are all relatively normal. he EMG/NCV of the lower extremities was normal as well.  Extensive blood work looking for autoimmune causes was similarly negative.    Patient Stated Goals  walk better, improve balance, endurance, strength    Currently in Pain?  No/denies                       [facility] Treatment/Exercise - 04/04/19 0947      Ambulation/Gait   Ambulation/Gait  Yes    Ambulation/Gait Assistance  5: Supervision    Ambulation/Gait Assistance Details  Verbal cues for erect posture and to increase step length    Ambulation Distance (Feet)  230 Feet    Assistive device  Straight cane   foot up brace on right   Gait Pattern  Step-through pattern;Decreased step length - right;Decreased step length - left;Decreased dorsiflexion - right;Decreased dorsiflexion - left      Neuro Re-ed    Neuro Re-ed Details   Gait weaving in and out of 5 cones to work on turns x 3 laps with SPC. Turning each direction x 2 all the way around cone. Pt less stable with turning towards the right. Gait with 180 degrees turns supervision x 3 each side. Stepping over 2" foam and back x 10 each side with SPC and then with right LE over 4" foam. Verbal cues to shift weight to back foot when stepping back. Pt needed CGA/min assist at times with stepping back over foam.      Exercises   Exercises  Other Exercises    Other Exercises   Step ups on 4" step forward and lateral with right LE x 10 each. Sidelying  right clamshell 10 x 2 with verbal and tactile cues for form.             PT Education - 04/04/19 2039    Education Details  Added clamshell to HEP    Person(s) Educated  Patient    Methods  Explanation;Demonstration;Handout    Comprehension  Verbalized understanding;Returned demonstration       PT Short Term Goals - 04/04/19 2044      PT SHORT TERM GOAL #1   Title  pt will be I and compliant with initial HEP. (Target for STG 4 weeks 04/07/19)    Baseline  Pt has been working on initial HEP    Status  Achieved      PT Rogers #2   Title  Pt will improve BERG balance to >40 to show improved balance    Baseline  37    Status  New        PT Long Term Goals - 03/08/19 2055      PT LONG TERM GOAL #1   Title  Pt will improve BERG balance >45 to show improved balance.    Baseline  37    Status  New      PT LONG TERM GOAL #2   Title  Pt will be able to ambulate  at least 750 feet mod I with LRAD with improved Rt foot clearance.    Status  New      PT LONG TERM GOAL #3   Title  Pt will perform TUG in less than 12 sec with LRAD mod I    Status  New      PT LONG TERM GOAL #4   Title  Consider performing 6MWT for endurance and write appropriate goal.            Plan - 04/04/19 2041    Clinical Impression Statement  Pt utilized foot up brace on right during session. Improved right toe clearance with this. Pt had decreased stability with SLS portion of dynamic balance activities.PT focused on turning activities today as patient report he feels least stable with turns. Steadier with turning to the left.  Will benefit from skilled PT to continue to address strength, balance and gait deficits. Wife does plan to get foot up brace for Christmas.    Personal Factors and Comorbidities  Comorbidity 1;Time since onset of injury/illness/exacerbation    Comorbidities  focal dystonia Rt LE since 2016    Examination-Activity Limitations  Bend;Carry;Squat;Stairs;Stand;Lift;Locomotion Level    Examination-Participation Restrictions  Meal Prep;Cleaning;Community Activity;Driving;Laundry;Yard Work    Merchant navy officer  Evolving/Moderate complexity    Rehab Potential  Fair    PT Frequency  2x / week    PT Duration  8 weeks    PT Treatment/Interventions  ADLs/Self Care Home Management;Aquatic Therapy;Electrical Stimulation;DME Instruction;Gait training;Stair training;Functional mobility training;Therapeutic activities;Therapeutic exercise;Balance training;Neuromuscular re-education;Manual techniques;Orthotic Fit/Training;Passive range of motion;Dry needling    PT Next Visit Plan  check Berg STG; additional exercises to work on step/weightshift emphasizing swing phase, foot clearance, heelstrike. Right hip strengthening.    PT Home Exercise Plan  HEP: Power 4 for parkinsons X 20 reps each, 2X a day, Ankle DF, INV, EV, with yellow 2X 10 ea    Consulted  and Agree with Plan of Care  Patient;Family member/caregiver    Family Member Consulted  wife       Patient will benefit from skilled therapeutic intervention in order to improve the following deficits and impairments:  Abnormal  gait, Decreased activity tolerance, Decreased balance, Decreased coordination, Decreased range of motion, Decreased strength, Difficulty walking, Impaired flexibility, Postural dysfunction  Visit Diagnosis: Other abnormalities of gait and mobility  Muscle weakness (generalized)     Problem List Patient Active Problem List   Diagnosis Date Noted  . 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 Neuropathy 08/11/2016    Ronn Melena, PT, DPT, NCS 04/04/2019, 8:47 PM  [facility] [address] [place], [place], [address] Phone: [phone]   Fax:  [phone]  Name: Joseph Richards MRN: [id number] Date of Birth: November 08, 1945

## 2019-04-10 ENCOUNTER — Encounter: Payer: Self-pay | Admitting: Physical Therapy

## 2019-04-10 ENCOUNTER — Ambulatory Visit: Payer: Medicare Other | Admitting: Physical Therapy

## 2019-04-10 ENCOUNTER — Other Ambulatory Visit: Payer: Self-pay

## 2019-04-10 DIAGNOSIS — R2689 Other abnormalities of gait and mobility: Secondary | ICD-10-CM

## 2019-04-10 DIAGNOSIS — M6281 Muscle weakness (generalized): Secondary | ICD-10-CM

## 2019-04-11 NOTE — Therapy (Signed)
[facility] [address] [place], [place], [address] Phone: [phone]   Fax:  [phone]  Physical Therapy Treatment  Patient Details  Name: LUIZ TRUMPOWER MRN: [id number] Date of Birth: 1946-01-23 Referring Provider (PT): Letta Pate Luanna Salk, MD   Encounter Date: 04/10/2019  PT End of Session - 04/11/19 0725    Visit Number  8    Number of Visits  16    Date for PT Re-Evaluation  05/03/19    Authorization Type  [employer] MCR, progress note at 21, KX at 15    PT Start Time  1319    PT Stop Time  1402    PT Time Calculation (min)  43 min    Equipment Utilized During Treatment  Gait belt    Activity Tolerance  Patient tolerated treatment well   Pt reports decreased stiffness at end of session   Behavior During Therapy  [facility] for tasks assessed/performed       Past Medical History:  Diagnosis Date  . Dystonia   . GERD (gastroesophageal reflux disease)   . Hyperlipidemia   . Hypertension     Past Surgical History:  Procedure Laterality Date  . HERNIA REPAIR      There were no vitals filed for this visit.  Subjective Assessment - 04/10/19 1323    Subjective  Lower legs feel stiff today.  Not sure why.    Pertinent History  Focal dystonia Rt LE,  Neurologic gait disorder    How long can you stand comfortably?  10 min    How long can you walk comfortably?  5 min using SPC    Diagnostic tests  MRI of the brain, cervical spine, lumbar spine and thoracic spine.  These are all relatively normal. he EMG/NCV of the lower extremities was normal as well.  Extensive blood work looking for autoimmune causes was similarly negative.    Patient Stated Goals  walk better, improve balance, endurance, strength    Currently in Pain?  No/denies                       [facility] Adult PT Treatment/Exercise - 04/11/19 0721      Standardized Balance Assessment   Standardized Balance Assessment  Berg Balance Test       Berg Balance Test   Sit to Stand  Able to stand  independently using hands    Standing Unsupported  Able to stand safely 2 minutes    Sitting with Back Unsupported but Feet Supported on Floor or Stool  Able to sit safely and securely 2 minutes    Stand to Sit  Sits safely with minimal use of hands    Transfers  Able to transfer safely, minor use of hands    Standing Unsupported with Eyes Closed  Able to stand 10 seconds with supervision    Standing Ubsupported with Feet Together  Able to place feet together independently and stand for 1 minute with supervision    From Standing, Reach Forward with Outstretched Arm  Can reach forward >12 cm safely (5")    From Standing Position, Pick up Object from Floor  Able to pick up shoe, needs supervision    From Standing Position, Turn to Look Behind Over each Shoulder  Looks behind from both sides and weight shifts well    Turn 360 Degrees  Able to turn 360 degrees safely but slowly    Standing Unsupported, Alternately Place Feet on Step/Stool  Able to complete >2 steps/needs minimal assist    Standing Unsupported, One Foot in Front  Able to plae foot ahead of the other independently and hold 30 seconds    Standing on One Leg  Tries to lift leg/unable to hold 3 seconds but remains standing independently    Total Score  42      High Level Balance   High Level Balance Activities  Turns    High Level Balance Comments  Worked at chair in front of mat (pt with difficulty sequencing support at chair with turn, so moved to counter), working on quarter/clock turns, sidestepping for 90 degree and 180 degree turns, R nad L, 5 reps each, each side      Knee/Hip Exercises: Standing   Forward Step Up  Right;Left;1 set;5 reps;Hand Hold: 1;Step Height: 4"   At counter, with cane as needed   Other Standing Knee Exercises  Forward step taps to 4" step, 10 reps each side, with UE support; cues from therapist for deliberate movements for foot clearance, upright posture.     Other Standing Knee Exercises  Forward step ups negotiating curb step, 8", using cane and HHA, x 4 reps, with cues for foot placement, foot clearance and placement of cane.      Progressed with turns at counter, to short distance gait and turns between counter, mat, x 3 reps, with cues for smooth transition from sidestepping turn to gait in the direction he is planning to turn.  Performed without cane, with therapist supervision.       PT Education - 04/11/19 0724    Education Details  turning techniques practiced during session    Person(s) Educated  Patient;Spouse    Methods  Explanation;Demonstration    Comprehension  Verbalized understanding;Returned demonstration;Verbal cues required       PT Short Term Goals - 04/04/19 2044      PT SHORT TERM GOAL #1   Title  pt will be I and compliant with initial HEP. (Target for STG 4 weeks 04/07/19)    Baseline  Pt has been working on initial HEP    Status  Achieved      PT SHORT TERM GOAL #2   Title  Pt will improve BERG balance to >40 to show improved balance    Baseline  37    Status  New        PT Long Term Goals - 03/08/19 2055      PT LONG TERM GOAL #1   Title  Pt will improve BERG balance >45 to show improved balance.    Baseline  37    Status  New      PT LONG TERM GOAL #2   Title  Pt will be able to ambulate at least 750 feet mod I with LRAD with improved Rt foot clearance.    Status  New      PT LONG TERM GOAL #3   Title  Pt will perform TUG in less than 12 sec with LRAD mod I    Status  New      PT LONG TERM GOAL #4   Title  Consider performing 6MWT for endurance and write appropriate goal.            Plan - 04/11/19 0726    Clinical Impression Statement  [facility], with pt improving score from 37/56 to 42/56, meeting STG for Berg.  He continues to have difficulty with single limb stance and  slowed, smaller steps with turns.  Focused remainder of session on turns and SLS  today.  Pt will continue to benefit from skilled PT to further address balance, strength and gait for improved overall mobility.    Personal Factors and Comorbidities  Comorbidity 1;Time since onset of injury/illness/exacerbation    Comorbidities  focal dystonia Rt LE since 2016    Examination-Activity Limitations  Bend;Carry;Squat;Stairs;Stand;Lift;Locomotion Level    Examination-Participation Restrictions  Meal Prep;Cleaning;Community Activity;Driving;Laundry;Yard Work    Conservation officer, historic buildings  Evolving/Moderate complexity    Rehab Potential  Fair    PT Frequency  2x / week    PT Duration  8 weeks    PT Treatment/Interventions  ADLs/Self Care Home Management;Aquatic Therapy;Electrical Stimulation;DME Instruction;Gait training;Stair training;Functional mobility training;Therapeutic activities;Therapeutic exercise;Balance training;Neuromuscular re-education;Manual techniques;Orthotic Fit/Training;Passive range of motion;Dry needling    PT Next Visit Plan  Review turning practice and formally add to HEP if needed; SLS, additional exercises to work on step/weightshift emphasizing swing phase, foot clearance, heelstrike. Right hip strengthening.  Perform 6 MWT if not already done and set LTG.   week of 11/30 is wk 5 of 8 in POC   PT Home Exercise Plan  HEP: Power 4 for parkinsons X 20 reps each, 2X a day, Ankle DF, INV, EV, with yellow 2X 10 ea    Consulted and Agree with Plan of Care  Patient;Family member/caregiver    Family Member Consulted  wife       Patient will benefit from skilled therapeutic intervention in order to improve the following deficits and impairments:  Abnormal gait, Decreased activity tolerance, Decreased balance, Decreased coordination, Decreased range of motion, Decreased strength, Difficulty walking, Impaired flexibility, Postural dysfunction  Visit Diagnosis: Other abnormalities of gait and mobility  Muscle weakness (generalized)     Problem  List Patient Active Problem List   Diagnosis Date Noted  . 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 Neuropathy 08/11/2016    Christpher Stogsdill W. 04/11/2019, 7:33 AM Gean Maidens., PT   [facility] [address] [place], [place], [address] Phone: [phone]   Fax:  [phone]  Name: JACHAI OKAZAKI MRN: [id number] Date of Birth: [birth year]

## 2019-04-12 ENCOUNTER — Ambulatory Visit: Payer: Medicare Other | Admitting: Physical Therapy

## 2019-04-14 ENCOUNTER — Ambulatory Visit: Payer: Medicare Other | Attending: Physical Medicine & Rehabilitation | Admitting: Physical Therapy

## 2019-04-14 ENCOUNTER — Other Ambulatory Visit: Payer: Self-pay

## 2019-04-14 DIAGNOSIS — R2689 Other abnormalities of gait and mobility: Secondary | ICD-10-CM

## 2019-04-14 DIAGNOSIS — M6281 Muscle weakness (generalized): Secondary | ICD-10-CM | POA: Insufficient documentation

## 2019-04-14 NOTE — Patient Instructions (Signed)
Use your PWR! Moves exercises when you get tired with walking:  1-minisquat to upright standing, 5-10 reps 2-side to side weigthshifting , 5-10 reps   Walk 2-3 laps around your house  -3 times a day  -Focus on upright posture, step length and striking with your heels

## 2019-04-15 NOTE — Therapy (Signed)
[facility] [address] [place], [place], [address] Phone: [phone]   Fax:  [phone]  Physical Therapy Treatment  Patient Details  Name: Joseph Richards MRN: [id number] Date of Birth: March 26, 1946 Referring Provider (PT): Letta Pate Luanna Salk, MD   Encounter Date: 04/14/2019  PT End of Session - 04/15/19 1300    Visit Number  9    Number of Visits  16    Date for PT Re-Evaluation  05/03/19    Authorization Type  [employer] MCR, progress note at 45, KX at 15    PT Start Time  1019    PT Stop Time  1102    PT Time Calculation (min)  43 min    Equipment Utilized During Treatment  Gait belt    Activity Tolerance  Patient tolerated treatment well    Behavior During Therapy  [facility] for tasks assessed/performed       Past Medical History:  Diagnosis Date  . Dystonia   . GERD (gastroesophageal reflux disease)   . Hyperlipidemia   . Hypertension     Past Surgical History:  Procedure Laterality Date  . HERNIA REPAIR      There were no vitals filed for this visit.  Subjective Assessment - 04/14/19 1021    Subjective  Legs are stiff today-usually like that in the mornings.  Not sure if if's the weather.    Pertinent History  Focal dystonia Rt LE,  Neurologic gait disorder    How long can you stand comfortably?  10 min    How long can you walk comfortably?  5 min using SPC    Diagnostic tests  MRI of the brain, cervical spine, lumbar spine and thoracic spine.  These are all relatively normal. he EMG/NCV of the lower extremities was normal as well.  Extensive blood work looking for autoimmune causes was similarly negative.    Patient Stated Goals  walk better, improve balance, endurance, strength    Currently in Pain?  No/denies         [facility] PT Assessment - 04/14/19 1023      6 Minute Walk- Baseline   6 Minute Walk- Baseline  yes    HR (bpm)  98    02 Sat (%RA)  76 %      6 Minute walk- Post Test   6 Minute Walk  Post Test  yes    HR (bpm)  84    02 Sat (%RA)  97 %    Modified Borg Scale for Dyspnea  0.5- Very, very slight shortness of breath    Perceived Rate of Exertion (Borg)  13- Somewhat hard      6 minute walk test results    Aerobic Endurance Distance Walked  729    Endurance additional comments  150 ft 1st minute; 115 ft 6th minute                   [facility] Adult PT Treatment/Exercise - 04/15/19 0001      High Level Balance   High Level Balance Activities  Turns    High Level Balance Comments  Worked at counter), working on Armed forces operational officer turns, sidestepping for 90 degree and 180 degree turns, R and L, 3 reps each, each side.  Progressed to short distance walking and turns with addition of carrying cones>addition of cognitive task.  Slowed pace of turns, gait with added cognitive task.          Balance Exercises -  04/14/19 1044      Balance Exercises: Standing   SLS  Eyes open;Solid surface;Upper extremity support 2;10 secs;2 reps;Upper extremity support 1   2 reps each, bilat UE support>1 UE support   Other Standing Exercises  Lateral weightshifting x 10 reps at counter, stagger stance forward/back weigthshifting at counter x 10 reps (cues for technique); wide BOS forward/back rocking thorugh hips x 10 reps.  Side step and weigthshift x 10 reps, back step and weigthshift x 10 reps.  PT provides cues for increased foot clearance, step length.        PT Education - 04/15/19 1259    Education Details  Ways to lessen fatigue with walking (standing weigthshifting activities versus sitting down to rest); walking laps in home    Person(s) Educated  Patient;Spouse    Methods  Explanation;Demonstration;Handout;Verbal cues    Comprehension  Verbalized understanding;Returned demonstration;Verbal cues required;Need further instruction       PT Short Term Goals - 04/04/19 2044      PT SHORT TERM GOAL #1   Title  pt will be I and compliant with initial HEP. (Target for STG 4  weeks 04/07/19)    Baseline  Pt has been working on initial HEP    Status  Achieved      PT SHORT TERM GOAL #2   Title  Pt will improve BERG balance to >40 to show improved balance    Baseline  37    Status  New        PT Long Term Goals - 04/15/19 1306      PT LONG TERM GOAL #1   Title  Pt will improve BERG balance >45 to show improved balance.    Baseline  37    Time  8    Period  Weeks    Status  On-going      PT LONG TERM GOAL #2   Title  Pt will be able to ambulate at least 750 feet mod I with LRAD with improved Rt foot clearance.    Time  8    Period  Weeks    Status  On-going      PT LONG TERM GOAL #3   Title  Pt will perform TUG in less than 12 sec with LRAD mod I    Time  8    Period  Weeks    Status  On-going      PT LONG TERM GOAL #4   Title  Pt will improve 6MWT by at least 50 ft for improved gait efficiency and endurance.    Time  8    Period  Weeks    Status  Revised            Plan - 04/15/19 1301    Clinical Impression Statement  Assessed 6MWT today, with pt ambulating 729 ft in 6 minutes (significantly decreased for age related normal values), with pt needing 2 standing breaks during 6 minutes.  Focused remainder of session on strategies patient can use to improve endurance with gait and to decrease stiffness/fatigue with longer bouts of gait.  Pt will continue to benefit from skilled PT to address balance, strength, and gait for improved overall mobility.    Personal Factors and Comorbidities  Comorbidity 1;Time since onset of injury/illness/exacerbation    Comorbidities  focal dystonia Rt LE since 2016    Examination-Activity Limitations  Bend;Carry;Squat;Stairs;Stand;Lift;Locomotion Level    Examination-Participation Restrictions  Meal Prep;Cleaning;Community Activity;Driving;Laundry;Yard Work    Conservation officer, historic buildingstability/Clinical Decision Making  Evolving/Moderate complexity    Rehab Potential  Fair    PT Frequency  2x / week    PT Duration  8 weeks    PT  Treatment/Interventions  ADLs/Self Care Home Management;Aquatic Therapy;Electrical Stimulation;DME Instruction;Gait training;Stair training;Functional mobility training;Therapeutic activities;Therapeutic exercise;Balance training;Neuromuscular re-education;Manual techniques;Orthotic Fit/Training;Passive range of motion;Dry needling    PT Next Visit Plan  Review HEP from this visit; SLS, additional exercise for step/weigthshfit, swing phase, foot clearance, heelstrike;gait with foot-up brace in sessions (pt to get one for Christmas); 10th visit progress note next visti   week of 11/30 is wk 5 of 8 in POC   PT Home Exercise Plan  HEP: Power 4 for parkinsons X 20 reps each, 2X a day, Ankle DF, INV, EV, with yellow 2X 10 ea    Consulted and Agree with Plan of Care  Patient;Family member/caregiver    Family Member Consulted  wife       Patient will benefit from skilled therapeutic intervention in order to improve the following deficits and impairments:  Abnormal gait, Decreased activity tolerance, Decreased balance, Decreased coordination, Decreased range of motion, Decreased strength, Difficulty walking, Impaired flexibility, Postural dysfunction  Visit Diagnosis: Other abnormalities of gait and mobility  Muscle weakness (generalized)     Problem List Patient Active Problem List   Diagnosis Date Noted  . 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 Neuropathy 08/11/2016    , W. 04/15/2019, 1:07 PM  Gean Maidens., PT   [facility] [address] [place], [place], [address] Phone: [phone]   Fax:  [phone]  Name: ELCHONON MAXSON MRN: [id number] Date of Birth: [birth year]

## 2019-04-17 ENCOUNTER — Ambulatory Visit: Payer: Medicare Other | Admitting: Physical Therapy

## 2019-04-17 ENCOUNTER — Other Ambulatory Visit: Payer: Self-pay

## 2019-04-17 DIAGNOSIS — R2689 Other abnormalities of gait and mobility: Secondary | ICD-10-CM

## 2019-04-17 DIAGNOSIS — M6281 Muscle weakness (generalized): Secondary | ICD-10-CM

## 2019-04-17 NOTE — Therapy (Signed)
[facility] [address] [place], [place], [address] Phone: [phone]   Fax:  [phone]  Physical Therapy Treatment/10th Visit Progress Note  Patient Details  Name: Joseph Richards MRN: [id number] Date of Birth: 09-13-1945 Referring Provider (PT): Letta Pate Luanna Salk, MD   Encounter Date: 04/17/2019  PT End of Session - 04/17/19 1922    Visit Number  10    Number of Visits  16    Date for PT Re-Evaluation  05/03/19    Authorization Type  [employer] MCR, progress note at 16, KX at 15    PT Start Time  1104    PT Stop Time  1144    PT Time Calculation (min)  40 min    Equipment Utilized During Treatment  Gait belt    Activity Tolerance  Patient tolerated treatment well    Behavior During Therapy  [facility] for tasks assessed/performed       Past Medical History:  Diagnosis Date  . Dystonia   . GERD (gastroesophageal reflux disease)   . Hyperlipidemia   . Hypertension     Past Surgical History:  Procedure Laterality Date  . HERNIA REPAIR      There were no vitals filed for this visit.  Subjective Assessment - 04/17/19 1107    Subjective  Used the stationary bike yesterday, so my legs are a little tired.  Been walking some more at home.    Pertinent History  Focal dystonia Rt LE,  Neurologic gait disorder    How long can you stand comfortably?  10 min    How long can you walk comfortably?  5 min using SPC    Diagnostic tests  MRI of the brain, cervical spine, lumbar spine and thoracic spine.  These are all relatively normal. he EMG/NCV of the lower extremities was normal as well.  Extensive blood work looking for autoimmune causes was similarly negative.    Patient Stated Goals  walk better, improve balance, endurance, strength    Currently in Pain?  No/denies                       [facility] Adult PT Treatment/Exercise - 04/17/19 0001      Ambulation/Gait   Ambulation/Gait  Yes    Ambulation/Gait  Assistance  5: Supervision    Ambulation/Gait Assistance Details  Cues for upright posture, increase RLE step length.  Cues for brief standing rest break for minisquat to upright posture prior to starting gait again.     Ambulation Distance (Feet)  150 Feet   x 2   Assistive device  Straight cane    Gait Pattern  Step-through pattern;Decreased step length - right;Decreased step length - left;Decreased dorsiflexion - right;Decreased dorsiflexion - left    Ambulation Surface  Level;Indoor          Balance Exercises - 04/17/19 1112      Balance Exercises: Standing   SLS  Eyes open;Solid surface;Upper extremity support 2;10 secs;2 reps;Upper extremity support 1   2 reps, progressing BUE support>1 UE support   SLS with Vectors  Solid surface;Upper extremity assist 1;Other reps (comment);Upper extremity assist 2   10 reps each:  6", 12", then 6">12">6" step taps   Standing, One Foot on a Step  Eyes open;6 inch;Head turns   Head nods x 5 reps each; alt UE lifts x 5 reps, 2 sets   Step Ups  Forward;Lateral;6 inch;UE support 2   step up/up, down/down fwd;  step up lateral   Other Standing Exercises  Forward single leg step up, 3 sec hold, x 5 reps each with BUE support.  At parallel bars on [employer]:  head turns, head nods x 5 reps, 2 sets with feet apart.  Side step and weigthshift x 10 reps, then side step on and off [employer], x 10 reps each direction.     With single limb stance activities at step, PT provides verbal and tactile cues for terminal knee extension and extension through stance hip.     PT Short Term Goals - 04/17/19 1925      PT SHORT TERM GOAL #1   Title  pt will be I and compliant with initial HEP. (Target for STG 4 weeks 04/07/19)    Baseline  Pt has been working on initial HEP    Status  Achieved      PT [place] #2   Title  Pt will improve BERG balance to >40 to show improved balance    Baseline  37    Status  Achieved        PT Long Term Goals - 04/15/19  1306      PT LONG TERM GOAL #1   Title  Pt will improve BERG balance >45 to show improved balance.    Baseline  37    Time  8    Period  Weeks    Status  On-going      PT LONG TERM GOAL #2   Title  Pt will be able to ambulate at least 750 feet mod I with LRAD with improved Rt foot clearance.    Time  8    Period  Weeks    Status  On-going      PT LONG TERM GOAL #3   Title  Pt will perform TUG in less than 12 sec with LRAD mod I    Time  8    Period  Weeks    Status  On-going      PT LONG TERM GOAL #4   Title  Pt will improve 6MWT by at least 50 ft for improved gait efficiency and endurance.    Time  8    Period  Weeks    Status  Revised            Plan - 04/17/19 1923    Clinical Impression Statement  10th Visit Progress note, spanning dates 03/08/2019-04/17/2019:  Pt and wife report improved upright posture with gait in therapy and in the house; pt notes increased stiffness in BLEs and decreased R foot clearance with prolonged gait.  Objective measures:  (taken at STG check last week), Merrilee Jansky 42/56 (improved from 37/56 at eval).  6 MWT 729 ft (lower than age related normal value).  Pt has met 2 of 2 STGs and is progressing towards LTGs.  He will continue to benefit from skilled PT to address decreased strength, decreased balance, abnormal gait and posture, for overall improved functional moiblity and decreased fall risk.    Personal Factors and Comorbidities  Comorbidity 1;Time since onset of injury/illness/exacerbation    Comorbidities  focal dystonia Rt LE since 2016    Examination-Activity Limitations  Bend;Carry;Squat;Stairs;Stand;Lift;Locomotion Level    Examination-Participation Restrictions  Meal Prep;Cleaning;Community Activity;Driving;Laundry;Yard Work    Merchant navy officer  Evolving/Moderate complexity    Rehab Potential  Fair    PT Frequency  2x / week    PT Duration  8 weeks    PT Treatment/Interventions  ADLs/Self Care Home Management;Aquatic  Therapy;Electrical Stimulation;DME Instruction;Gait training;Stair training;Functional mobility training;Therapeutic activities;Therapeutic exercise;Balance training;Neuromuscular re-education;Manual techniques;Orthotic Fit/Training;Passive range of motion;Dry needling    PT Next Visit Plan  Continue to work on SLS, additional exercise for step/weigthshfit, swing phase, foot clearance, heelstrike;gait with foot-up brace in sessions (pt to get one for Christmas)   wk of 12/7 is 6 of 8 in POC   PT Home Exercise Plan  HEP: Power 4 for parkinsons X 20 reps each, 2X a day, Ankle DF, INV, EV, with yellow 2X 10 ea    Consulted and Agree with Plan of Care  Patient;Family member/caregiver    Family Member Consulted  wife       Patient will benefit from skilled therapeutic intervention in order to improve the following deficits and impairments:  Abnormal gait, Decreased activity tolerance, Decreased balance, Decreased coordination, Decreased range of motion, Decreased strength, Difficulty walking, Impaired flexibility, Postural dysfunction  Visit Diagnosis: Muscle weakness (generalized)  Other abnormalities of gait and mobility     Problem List Patient Active Problem List   Diagnosis Date Noted  . 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 Neuropathy 08/11/2016    Douglas Rooks W. 04/17/2019, 7:28 PM  Frazier Butt., PT   [facility] [address] [place], [place], [address] Phone: [phone]   Fax:  [phone]  Name: Joseph Richards MRN: [id number] Date of Birth: 1945/12/26

## 2019-04-20 ENCOUNTER — Ambulatory Visit: Payer: Medicare Other

## 2019-04-20 ENCOUNTER — Other Ambulatory Visit: Payer: Self-pay

## 2019-04-20 DIAGNOSIS — R2689 Other abnormalities of gait and mobility: Secondary | ICD-10-CM | POA: Diagnosis not present

## 2019-04-20 DIAGNOSIS — M6281 Muscle weakness (generalized): Secondary | ICD-10-CM

## 2019-04-20 NOTE — Therapy (Signed)
[facility] [address] [place], [place], [address] Phone: [phone]   Fax:  [phone]  Physical Therapy Treatment  Patient Details  Name: Joseph MedalRalph B Richards MRN: [id number] Date of Birth: January 27, 1946 Referring Provider (PT): Wynn BankerKirsteins, Victorino SparrowAndrew E, MD   Encounter Date: 04/20/2019  PT End of Session - 04/20/19 1020    Visit Number  11    Number of Visits  16    Date for PT Re-Evaluation  05/03/19    Authorization Type  U[employer] MCR, progress note at 10, KX at 15    PT Start Time  1018    PT Stop Time  1100    PT Time Calculation (min)  42 min    Equipment Utilized During Treatment  Gait belt    Activity Tolerance  Patient tolerated treatment well    Behavior During Therapy  [facility] for tasks assessed/performed       Past Medical History:  Diagnosis Date  . Dystonia   . GERD (gastroesophageal reflux disease)   . Hyperlipidemia   . Hypertension     Past Surgical History:  Procedure Laterality Date  . HERNIA REPAIR      There were no vitals filed for this visit.  Subjective Assessment - 04/20/19 1021    Subjective  Pt reports he is a little stiff and sore from exercising but doing well. Reports that walking down driveway to get mail with cane does fatigue him.    Pertinent History  Focal dystonia Rt LE,  Neurologic gait disorder    How long can you stand comfortably?  10 min    How long can you walk comfortably?  5 min using SPC    Diagnostic tests  MRI of the brain, cervical spine, lumbar spine and thoracic spine.  These are all relatively normal. he EMG/NCV of the lower extremities was normal as well.  Extensive blood work looking for autoimmune causes was similarly negative.    Patient Stated Goals  walk better, improve balance, endurance, strength                       [facility] Adult PT Treatment/Exercise - 04/20/19 1024      Transfers   Transfers  Sit to Stand;Stand to Sit    Sit to Stand  6:  Modified independent (Device/Increase time)    Stand to Sit  6: Modified independent (Device/Increase time)      Ambulation/Gait   Ambulation/Gait  Yes    Ambulation/Gait Assistance  5: Supervision    Ambulation/Gait Assistance Details  Verbal cues to try to use more hip flexion to clear right foot    Ambulation Distance (Feet)  230 Feet    Assistive device  Straight cane    Gait Pattern  Step-through pattern;Decreased dorsiflexion - right    Ambulation Surface  Level;Indoor      Neuro Re-ed    Neuro Re-ed Details   Standing on rockerboard positioned ant/post maintaining level x 30 sec eyes open, rocking ant/post x 10, head turns left/right and up/down x 10.  CGA on rockerboard for safetey as did lose balance both directions a couple times. Stepping over 3 hurdles different height with 1 UE support on bar x 3 laps with ste-to pattern then x 3 laps with reciprocal pattern. Pt needed max cues and demonstration for reciprocal pattern with some difficulty clearing right foot at times. Side stepping over 3 hurdles x 2 laps. After ambulated another 10115'  with  SPC  with cues to pick up more from hip on right but leg fatigued after exercises. Step to 2nd step at stairs x 10.      Exercises   Exercises  Other Exercises    Other Exercises   Step-ups on 4" step x 10 forward with right then x 10 with right on 6" step forward and lateral with cues to stand tall each time             PT Education - 04/20/19 1119    Education Details  Pt to continue with current HEP    Person(s) Educated  Patient    Methods  Explanation    Comprehension  Verbalized understanding       PT Short Term Goals - 04/17/19 1925      PT SHORT TERM GOAL #1   Title  pt will be I and compliant with initial HEP. (Target for STG 4 weeks 04/07/19)    Baseline  Pt has been working on initial HEP    Status  Achieved      PT [place] #2   Title  Pt will improve BERG balance to >40 to show improved balance     Baseline  37    Status  Achieved        PT Long Term Goals - 04/15/19 1306      PT LONG TERM GOAL #1   Title  Pt will improve BERG balance >45 to show improved balance.    Baseline  37    Time  8    Period  Weeks    Status  On-going      PT LONG TERM GOAL #2   Title  Pt will be able to ambulate at least 750 feet mod I with LRAD with improved Rt foot clearance.    Time  8    Period  Weeks    Status  On-going      PT LONG TERM GOAL #3   Title  Pt will perform TUG in less than 12 sec with LRAD mod I    Time  8    Period  Weeks    Status  On-going      PT LONG TERM GOAL #4   Title  Pt will improve 6MWT by at least 50 ft for improved gait efficiency and endurance.    Time  8    Period  Weeks    Status  Revised            Plan - 04/20/19 1021    Clinical Impression Statement  Pt is noted to have improving upright posture with gait and balance activities. As fatigues he does have more trouble clearing right foot despite cuing to try to use more hip flexion.    Personal Factors and Comorbidities  Comorbidity 1;Time since onset of injury/illness/exacerbation    Comorbidities  focal dystonia Rt LE since 2016    Examination-Activity Limitations  Bend;Carry;Squat;Stairs;Stand;Lift;Locomotion Level    Examination-Participation Restrictions  Meal Prep;Cleaning;Community Activity;Driving;Laundry;Yard Work    Merchant navy officer  Evolving/Moderate complexity    Rehab Potential  Fair    PT Frequency  2x / week    PT Duration  8 weeks    PT Treatment/Interventions  ADLs/Self Care Home Management;Aquatic Therapy;Electrical Stimulation;DME Instruction;Gait training;Stair training;Functional mobility training;Therapeutic activities;Therapeutic exercise;Balance training;Neuromuscular re-education;Manual techniques;Orthotic Fit/Training;Passive range of motion;Dry needling    PT Next Visit Plan  Continue to work on SLS, additional exercise for step/weigthshfit, swing phase,  foot clearance, heelstrike;gait with foot-up brace in sessions (pt to get one for Christmas)   wk of 12/7 is 6 of 8 in POC   PT Home Exercise Plan  HEP: Power 4 for parkinsons X 20 reps each, 2X a day, Ankle DF, INV, EV, with yellow 2X 10 ea    Consulted and Agree with Plan of Care  Patient;Family member/caregiver    Family Member Consulted  wife       Patient will benefit from skilled therapeutic intervention in order to improve the following deficits and impairments:  Abnormal gait, Decreased activity tolerance, Decreased balance, Decreased coordination, Decreased range of motion, Decreased strength, Difficulty walking, Impaired flexibility, Postural dysfunction  Visit Diagnosis: Muscle weakness (generalized)  Other abnormalities of gait and mobility     Problem List Patient Active Problem List   Diagnosis Date Noted  . 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 Neuropathy 08/11/2016    Ronn Melena, PT, DPT, NCS 04/20/2019, 11:21 AM  [facility] [address] [place], [place], [address] Phone: [phone]   Fax:  [phone]  Name: DSHAUN REPPUCCI MRN: [id number] Date of Birth: 1945/09/01

## 2019-04-24 ENCOUNTER — Ambulatory Visit: Payer: Medicare Other | Admitting: Physical Therapy

## 2019-04-24 ENCOUNTER — Other Ambulatory Visit: Payer: Self-pay

## 2019-04-24 DIAGNOSIS — R2689 Other abnormalities of gait and mobility: Secondary | ICD-10-CM | POA: Diagnosis not present

## 2019-04-24 DIAGNOSIS — M6281 Muscle weakness (generalized): Secondary | ICD-10-CM

## 2019-04-24 NOTE — Therapy (Signed)
[facility] [address] [place], [place], [address] Phone: [phone]   Fax:  [phone]  Physical Therapy Treatment  Patient Details  Name: Joseph MedalRalph B Basques MRN: [id number] Date of Birth: November 17, 1945 Referring Provider (PT): Wynn BankerKirsteins, Victorino SparrowAndrew E, MD   Encounter Date: 04/24/2019  PT End of Session - 04/24/19 1451    Visit Number  12    Number of Visits  16    Date for PT Re-Evaluation  05/03/19    Authorization Type  UHC MCR, progress note at 10, KX at 15    PT Start Time  1022    PT Stop Time  1100    PT Time Calculation (min)  38 min    Equipment Utilized During Treatment  Gait belt    Activity Tolerance  Patient tolerated treatment well    Behavior During Therapy  [facility] for tasks assessed/performed       Past Medical History:  Diagnosis Date  . Dystonia   . GERD (gastroesophageal reflux disease)   . Hyperlipidemia   . Hypertension     Past Surgical History:  Procedure Laterality Date  . HERNIA REPAIR      There were no vitals filed for this visit.  Subjective Assessment - 04/24/19 1023    Subjective  Sore from doing a lot of yard work over the weekend.  Soreness in muscles in lower legs    Pertinent History  Focal dystonia Rt LE,  Neurologic gait disorder    How long can you stand comfortably?  10 min    How long can you walk comfortably?  5 min using SPC    Diagnostic tests  MRI of the brain, cervical spine, lumbar spine and thoracic spine.  These are all relatively normal. he EMG/NCV of the lower extremities was normal as well.  Extensive blood work looking for autoimmune causes was similarly negative.    Patient Stated Goals  walk better, improve balance, endurance, strength    Currently in Pain?  Yes    Pain Score  5     Pain Location  Leg    Pain Orientation  Right;Left    Pain Descriptors / Indicators  Sore    Pain Type  Acute pain    Pain Onset  Yesterday    Aggravating Factors   working in  the yard    Pain Relieving Factors  sometimes stretches                       [facility] Adult PT Treatment/Exercise - 04/24/19 0001      Ambulation/Gait   Ambulation/Gait  Yes    Ambulation/Gait Assistance  5: Supervision    Ambulation/Gait Assistance Details  Verbal cues for slowed pace for improved R foot clearance    Ambulation Distance (Feet)  230 Feet   25 ft x 2   Assistive device  Straight cane    Gait Pattern  Step-through pattern;Decreased dorsiflexion - right    Ambulation Surface  Level;Indoor      Knee/Hip Exercises: Stretches   ITB Stretch  Right;Left;3 reps;30 seconds   Standing at Anadark[employer] Pet[employer] Corporationcounter-visual cues from therapist   Other Knee/Hip Stretches  Attempted hip adductor stretch, crossing legs in sitting-pt has difficulty with this position.  Transitioned to standing step out to the side and lean into flexed knee for contralateral inner thigh stretch.  Cues for technique and form, 3 reps x 15 seconds.      Knee/Hip Exercises: Standing  Functional Squat  2 sets;5 reps   Reviewed technique-to keep keeps neutral versus adducted   Functional Squat Limitations  Squatting to pick up cones-cues to get close to object being picked up          Balance Exercises - 04/24/19 1442      Balance Exercises: Standing   Rockerboard  Anterior/posterior;Lateral;Head turns;Intermittent UE support   Hip/ankle strategy; UE lifts 3 x 5 reps; head nods   Other Standing Exercises  With work on rockerboard, cues for use of ankle/hips to increase movement on board and to feel limits of stability..  Pt requires verbal cues from PT and visual cues from mirror to help increase movement.          PT Short Term Goals - 04/17/19 1925      PT SHORT TERM GOAL #1   Title  pt will be I and compliant with initial HEP. (Target for STG 4 weeks 04/07/19)    Baseline  Pt has been working on initial HEP    Status  Achieved      PT [place] #2   Title  Pt will improve BERG balance  to >40 to show improved balance    Baseline  37    Status  Achieved        PT Long Term Goals - 04/15/19 1306      PT LONG TERM GOAL #1   Title  Pt will improve BERG balance >45 to show improved balance.    Baseline  37    Time  8    Period  Weeks    Status  On-going      PT LONG TERM GOAL #2   Title  Pt will be able to ambulate at least 750 feet mod I with LRAD with improved Rt foot clearance.    Time  8    Period  Weeks    Status  On-going      PT LONG TERM GOAL #3   Title  Pt will perform TUG in less than 12 sec with LRAD mod I    Time  8    Period  Weeks    Status  On-going      PT LONG TERM GOAL #4   Title  Pt will improve 6MWT by at least 50 ft for improved gait efficiency and endurance.    Time  8    Period  Weeks    Status  Revised            Plan - 04/24/19 1452    Clinical Impression Statement  Pt continues to c/o stiffness and fatigue in legs, especially after increased activity level.  Discussed stretches and strengthening to help with this; however, with pt demonstrating repeated squats for yardwork, question that hip internal rotation/adduction play a factor in pt's muscle soreness.  Educated that this is not best technique for repeated squats and may lead to pain over time.  With squat activity and balance in parallel bars, pt responds well to verbal and visual cues to reset posture to decrease hip internal rotation/adduction in standing position.    Personal Factors and Comorbidities  Comorbidity 1;Time since onset of injury/illness/exacerbation    Comorbidities  focal dystonia Rt LE since 2016    Examination-Activity Limitations  Bend;Carry;Squat;Stairs;Stand;Lift;Locomotion Level    Examination-Participation Restrictions  Meal Prep;Cleaning;Community Activity;Driving;Laundry;Yard Work    Database administrator  Fair    PT Frequency  2x / week    PT Duration  8 weeks    PT  Treatment/Interventions  ADLs/Self Care Home Management;Aquatic Therapy;Electrical Stimulation;DME Instruction;Gait training;Stair training;Functional mobility training;Therapeutic activities;Therapeutic exercise;Balance training;Neuromuscular re-education;Manual techniques;Orthotic Fit/Training;Passive range of motion;Dry needling    PT Next Visit Plan  Work on hip, trunk strengthening foot clearance and heelstrike with gait; gait with foot-up brace in sessions (pt to get one for Christmas); Work on IT band stretch,inner thigh stretch and add to HEP   wk of 12/14 is 7 of 8 in POC   PT Home Exercise Plan  HEP: Power 4 for parkinsons X 20 reps each, 2X a day, Ankle DF, INV, EV, with yellow 2X 10 ea    Consulted and Agree with Plan of Care  Patient;Family member/caregiver    Family Member Consulted  wife       Patient will benefit from skilled therapeutic intervention in order to improve the following deficits and impairments:  Abnormal gait, Decreased activity tolerance, Decreased balance, Decreased coordination, Decreased range of motion, Decreased strength, Difficulty walking, Impaired flexibility, Postural dysfunction  Visit Diagnosis: Muscle weakness (generalized)  Other abnormalities of gait and mobility     Problem List Patient Active Problem List   Diagnosis Date Noted  . 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 Neuropathy 08/11/2016    Neve Branscomb W. 04/24/2019, 2:58 PM Gean Maidens., PT [facility] [address] [place], [place], [address] Phone: [phone]   Fax:  [phone]  Name: ASHLIN HIDALGO MRN: [id number] Date of Birth: [birth year]

## 2019-04-27 ENCOUNTER — Ambulatory Visit: Payer: Medicare Other | Admitting: Physical Therapy

## 2019-04-27 ENCOUNTER — Encounter: Payer: Self-pay | Admitting: Physical Therapy

## 2019-04-27 ENCOUNTER — Other Ambulatory Visit: Payer: Self-pay

## 2019-04-27 DIAGNOSIS — R2689 Other abnormalities of gait and mobility: Secondary | ICD-10-CM

## 2019-04-27 DIAGNOSIS — M6281 Muscle weakness (generalized): Secondary | ICD-10-CM

## 2019-04-27 NOTE — Patient Instructions (Signed)
Access Code: [email or web address]  URL: [email or web address]  Date: 04/27/2019  Prepared by: Mady Haagensen   Exercises Seated Hamstring Curl with Anchored Resistance - 10 reps - 2 sets - 1x daily - 5x weekly Seated Hamstring Stretch - 3 reps - 1 sets - 30 sec hold - 1x daily - 5x weekly Supine Bridge - 10 reps - 1-2 sets - 1x daily - 5x weekly Seated Toe Raise - 10 reps - 2 sets - 1x daily - 5x weekly Long Sitting Isometric Ankle Eversion in Dorsiflexion with Ball at [place] - 10 reps - 2 sets - 1x daily - 5x weekly Standing Gastroc Stretch - 1 reps - 1 sets - 30 sec hold - 1x daily - 5x weekly Clamshell - 10 reps - 2 sets - 2x daily - 5x weekly  Added 04/27/2019 Supine Quadriceps Stretch with Strap on Table - 3 reps - 1 sets - 30 sec hold - 1-2x daily - 7x weekly

## 2019-04-27 NOTE — Therapy (Signed)
[facility] [address] [place], [place], [address] Phone: [phone]   Fax:  [phone]  Physical Therapy Treatment  Patient Details  Name: Joseph Richards MRN: [id number] Date of Birth: 1946/03/04 Referring Provider (PT): Wynn BankerKirsteins, Victorino SparrowAndrew E, MD   Encounter Date: 04/27/2019  PT End of Session - 04/27/19 1257    Visit Number  13    Number of Visits  16    Date for PT Re-Evaluation  05/03/19    Authorization Type  UHC MCR, progress note at 10, KX at 15    PT Start Time  1018    PT Stop Time  1102    PT Time Calculation (min)  44 min    Equipment Utilized During Treatment  Gait belt    Activity Tolerance  Patient tolerated treatment well    Behavior During Therapy  [facility] for tasks assessed/performed       Past Medical History:  Diagnosis Date  . Dystonia   . GERD (gastroesophageal reflux disease)   . Hyperlipidemia   . Hypertension     Past Surgical History:  Procedure Laterality Date  . HERNIA REPAIR      There were no vitals filed for this visit.  Subjective Assessment - 04/27/19 1023    Subjective  Therapy has been helpful, but has not completely eliminated my dragging the R foot.  The exercises are helpful.    Pertinent History  Focal dystonia Rt LE,  Neurologic gait disorder    How long can you stand comfortably?  10 min    How long can you walk comfortably?  5 min using SPC    Diagnostic tests  MRI of the brain, cervical spine, lumbar spine and thoracic spine.  These are all relatively normal. he EMG/NCV of the lower extremities was normal as well.  Extensive blood work looking for autoimmune causes was similarly negative.    Patient Stated Goals  walk better, improve balance, endurance, strength    Currently in Pain?  No/denies    Pain Onset  Rodney CruiseYesterday                       [facility] Adult PT Treatment/Exercise - 04/27/19 0001      Ambulation/Gait   Ambulation/Gait  Yes    Ambulation/Gait Assistance  5: Supervision    Ambulation/Gait Assistance Details  Wearing R foot up brace, cues to slow pace for foot clearance    Ambulation Distance (Feet)  230 Feet   80 ft x 2   Assistive device  Straight cane    Gait Pattern  Step-through pattern;Decreased dorsiflexion - right    Ambulation Surface  Level;Indoor      Knee/Hip Exercises: Stretches   Higher education careers adviserQuad Stretch  Right;2 reps;30 seconds    LobbyistQuad Stretch Limitations  Supine off edge of mat-cues for technique, use of strap to increase stretch    ITB Stretch  Right;Left;2 reps;30 seconds    ITB Stretch Limitations  Cues for form and technique    Other Knee/Hip Stretches  Inner thigh stretch at counter, 2 reps 30 seconds each leg.      Knee/Hip Exercises: Standing   Functional Squat  1 set;10 reps    Other Standing Knee Exercises  Forward step taps to 4" step, 10 reps each side, with UE support; cues from therapist for deliberate movements for foot clearance, upright posture.  Progressed to step over 2" obstacle each leg 10 reps; then side step over  2" obstacle, 2 sets x 10 reps with UE support, cues for upright posture.  Cues for heelstrike with forward step over obstacle.  Progressed to forward step over, then step and weigthshift posteriorly (to work on lifting toes through front foot)      Supine at edge of mat: R hip/knee flexed, on and off mat, 10 reps, then 5 reps, for improved R hip flexion strength and control.       PT Education - 04/27/19 1256    Education Details  Addition to HEP for quad stretch    Person(s) Educated  Patient    Methods  Explanation;Demonstration;Handout;Verbal cues    Comprehension  Verbalized understanding;Returned demonstration;Verbal cues required       PT Short Term Goals - 04/17/19 1925      PT SHORT TERM GOAL #1   Title  pt will be I and compliant with initial HEP. (Target for STG 4 weeks 04/07/19)    Baseline  Pt has been working on initial HEP    Status  Achieved      PT  SHORT TERM GOAL #2   Title  Pt will improve BERG balance to >40 to show improved balance    Baseline  37    Status  Achieved        PT Long Term Goals - 04/15/19 1306      PT LONG TERM GOAL #1   Title  Pt will improve BERG balance >45 to show improved balance.    Baseline  37    Time  8    Period  Weeks    Status  On-going      PT LONG TERM GOAL #2   Title  Pt will be able to ambulate at least 750 feet mod I with LRAD with improved Rt foot clearance.    Time  8    Period  Weeks    Status  On-going      PT LONG TERM GOAL #3   Title  Pt will perform TUG in less than 12 sec with LRAD mod I    Time  8    Period  Weeks    Status  On-going      PT LONG TERM GOAL #4   Title  Pt will improve by at least 50 ft for improved gait efficiency and endurance.    Time  8    Period  Weeks    Status  Revised            Plan - 04/27/19 1256    Clinical Impression Statement  Focus of today's session on strengthening, stretching, and exercises to work on foot clearance.  PT provides cues for upright posture throughout, and with exercises, pt is improving with foot clearance.  With gait, he has improved foot clearance iwth use of R foot up brace.  He will continue to benefit from skilled PT to address strength, balance, improved gait.    Personal Factors and Comorbidities  Comorbidity 1;Time since onset of injury/illness/exacerbation    Comorbidities  focal dystonia Rt LE since 2016    Examination-Activity Limitations  Bend;Carry;Squat;Stairs;Stand;Lift;Locomotion Level    Examination-Participation Restrictions  Meal Prep;Cleaning;Community Activity;Driving;Laundry;Yard Work    Conservation officer, historic buildings  Evolving/Moderate complexity    Rehab Potential  Fair    PT Frequency  2x / week    PT Duration  8 weeks    PT Treatment/Interventions  ADLs/Self Care Home Management;Aquatic Therapy;Electrical Stimulation;DME Instruction;Gait training;Stair training;Functional mobility  training;Therapeutic activities;Therapeutic  exercise;Balance training;Neuromuscular re-education;Manual techniques;Orthotic Fit/Training;Passive range of motion;Dry needling    PT Next Visit Plan  Check LTGs next week; will need to decide renew vs. d/c (pt has more appts after next week); gait iwth foot-up brace, foot clearance and heelstrike   wk of 12/14 is 7 of 8 in POC   PT Home Exercise Plan  HEP: Power 4 for parkinsons X 20 reps each, 2X a day, Ankle DF, INV, EV, with yellow 2X 10 ea    Consulted and Agree with Plan of Care  Patient;Family member/caregiver    Family Member Consulted  wife       Patient will benefit from skilled therapeutic intervention in order to improve the following deficits and impairments:  Abnormal gait, Decreased activity tolerance, Decreased balance, Decreased coordination, Decreased range of motion, Decreased strength, Difficulty walking, Impaired flexibility, Postural dysfunction  Visit Diagnosis: Muscle weakness (generalized)  Other abnormalities of gait and mobility     Problem List Patient Active Problem List   Diagnosis Date Noted  . 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 Neuropathy 08/11/2016    Shyna Duignan W. 04/27/2019, 1:04 PM  Frazier Butt., PT   [place] [address] [place], [place], [address] Phone: [phone]   Fax:  [phone]  Name: Joseph Richards MRN: [id number] Date of Birth: March 29, 1946

## 2019-05-01 ENCOUNTER — Other Ambulatory Visit: Payer: Self-pay

## 2019-05-01 ENCOUNTER — Ambulatory Visit: Payer: Medicare Other | Admitting: Physical Therapy

## 2019-05-01 DIAGNOSIS — R2689 Other abnormalities of gait and mobility: Secondary | ICD-10-CM | POA: Diagnosis not present

## 2019-05-01 DIAGNOSIS — M6281 Muscle weakness (generalized): Secondary | ICD-10-CM

## 2019-05-02 NOTE — Therapy (Signed)
[facility] [address] [place], [place], [address] Phone: [phone]   Fax:  [phone]  Physical Therapy Treatment  Patient Details  Name: Joseph Richards MRN: [id number] Date of Birth: 1945/09/19 Referring Provider (PT): Joseph Pate Luanna Salk, MD   Encounter Date: 05/01/2019  PT End of Session - 05/02/19 1320    Visit Number  14    Number of Visits  16    Date for PT Re-Evaluation  05/03/19    Authorization Type  [employer] MCR, progress note at 84, KX at 32    PT Start Time  1020    PT Stop Time  1100    PT Time Calculation (min)  40 min    Equipment Utilized During Treatment  Gait belt    Activity Tolerance  Patient tolerated treatment well    Behavior During Therapy  [facility] for tasks assessed/performed       Past Medical History:  Diagnosis Date  . Dystonia   . GERD (gastroesophageal reflux disease)   . Hyperlipidemia   . Hypertension     Past Surgical History:  Procedure Laterality Date  . HERNIA REPAIR      There were no vitals filed for this visit.  Subjective Assessment - 05/01/19 1020    Subjective  Really feeling my age today so far.    Pertinent History  Focal dystonia Rt LE,  Neurologic gait disorder    How long can you stand comfortably?  10 min    How long can you walk comfortably?  5 min using SPC    Diagnostic tests  MRI of the brain, cervical spine, lumbar spine and thoracic spine.  These are all relatively normal. he EMG/NCV of the lower extremities was normal as well.  Extensive blood work looking for autoimmune causes was similarly negative.    Patient Stated Goals  walk better, improve balance, endurance, strength    Currently in Pain?  No/denies    Pain Onset  Joseph Richards Adult PT Treatment/Exercise - 05/02/19 1310      Ambulation/Gait   Ambulation/Gait  Yes    Ambulation/Gait Assistance  5: Supervision    Ambulation/Gait Assistance Details   Wearing R foot up brace throughout session    Ambulation Distance (Feet)  750 Feet    Assistive device  Straight cane    Gait Pattern  Step-through pattern;Decreased dorsiflexion - right;Poor foot clearance - right    Ambulation Surface  Level;Indoor    Pre-Gait Activities  At counter, using RUE at counter, cane in LUE, with cues for slowed gait, increased step length, deliberate cane sequence.  Then along counter with LUE and cues for deliberate, long step length.  Repeated 5 reps 20 ft along counter.    Gait Comments  6MWT 649 ft using cane.  Additional gait training using SPC, with verbal and tactile cues for sequencing with cane with gait.  Attempted work on cane sequence LUE with RLE.  Pt tends to take 3-4 steps prior to moving cane, despite cues and assistance from therapist.      Standardized Balance Assessment   Standardized Balance Assessment  Timed Up and Go Test      Timed Up and Go Test   TUG  Normal TUG    Normal TUG (seconds)  15.31   15.97  PT Education - 05/02/19 1318    Education Details  Progress towards goals, POC (agreed to continue additional 4 weeks, reducing to 1x/wk after next week), for additional gait training with pt's foot-up brace    Person(s) Educated  Patient;Spouse    Methods  Explanation    Comprehension  Verbalized understanding       PT Short Term Goals - 04/17/19 1925      PT SHORT TERM GOAL #1   Title  pt will be I and compliant with initial HEP. (Target for STG 4 weeks 04/07/19)    Baseline  Pt has been working on initial HEP    Status  Achieved      PT [place] #2   Title  Pt will improve BERG balance to >40 to show improved balance    Baseline  37    Status  Achieved        PT Long Term Goals - 05/01/19 1028      PT LONG TERM GOAL #1   Title  Pt will improve BERG balance >45 to show improved balance.    Baseline  37    Time  8    Period  Weeks    Status  On-going      PT LONG TERM GOAL #2   Title  Pt  will be able to ambulate at least 750 feet mod I with LRAD with improved Rt foot clearance.    Baseline  Supervision x 750 ft, due to decreased R foot clearance (pt has not yet received his R foot-up brace)    Time  8    Period  Weeks    Status  Partially Met      PT LONG TERM GOAL #3   Title  Pt will perform TUG in less than 12 sec with LRAD mod I    Baseline  15.31 sec 05/01/2019    Time  8    Period  Weeks    Status  Not Met      PT LONG TERM GOAL #4   Title  Pt will improve 6MWT by at least 50 ft for improved gait efficiency and endurance.    Baseline  Initial 6MWT 510 ft; 05/01/2019:  6MWT 649 ft    Time  8    Period  Weeks    Status  Achieved            Plan - 05/02/19 1502    Clinical Impression Statement  Began assessing LTGs this visit, with LTG 2 partially met for gait distance (pt is ambulating 750 ft, but with supervision and cane).  LTG 3 not met for TUG score.  LTG 4 met for improved distance in 6MWT (since initial 6MWT distance of 510 ft, pt has improved to 729 feet and 649 ft).  Pt is making slow, steady progress, with improved posture/decreased crouching of gait noted.  With fatigue, he is noted to have decreased R foot clearance.  He is planning to receive foot up brace for Christmas, so he should have his available for use more consistently next week.  He will continue to benefit from further skilled PT to address functional stregnth, gait and balance.    Personal Factors and Comorbidities  Comorbidity 1;Time since onset of injury/illness/exacerbation    Comorbidities  focal dystonia Rt LE since 2016    Examination-Activity Limitations  Bend;Carry;Squat;Stairs;Stand;Lift;Locomotion Level    Examination-Participation Restrictions  Meal Prep;Cleaning;Community Activity;Driving;Laundry;Yard Work    Merchant navy officer  Evolving/Moderate complexity    Rehab Potential  Fair    PT Frequency  2x / week    PT Duration  8 weeks    PT  Treatment/Interventions  ADLs/Self Care Home Management;Aquatic Therapy;Electrical Stimulation;DME Instruction;Gait training;Stair training;Functional mobility training;Therapeutic activities;Therapeutic exercise;Balance training;Neuromuscular re-education;Manual techniques;Orthotic Fit/Training;Passive range of motion;Dry needling    PT Next Visit Plan  Check remaining LTG-Berg; consider checking DGI or FGA for further dynamic gait assessment; gait with foot-up brace; need to recert for 4 additional weeks (2x/next week, then 1x/3 weeks)   wk of 12/21 is week 8 of 8 in POC   PT Home Exercise Plan  HEP: Power 4 for parkinsons X 20 reps each, 2X a day, Ankle DF, INV, EV, with yellow 2X 10 ea    Consulted and Agree with Plan of Care  Patient;Family member/caregiver    Family Member Consulted  wife       Patient will benefit from skilled therapeutic intervention in order to improve the following deficits and impairments:  Abnormal gait, Decreased activity tolerance, Decreased balance, Decreased coordination, Decreased range of motion, Decreased strength, Difficulty walking, Impaired flexibility, Postural dysfunction  Visit Diagnosis: Other abnormalities of gait and mobility  Muscle weakness (generalized)     Problem List Patient Active Problem List   Diagnosis Date Noted  . 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 Neuropathy 08/11/2016    Eusebia Grulke W. 05/02/2019, 3:09 PM  Frazier Butt., PT   [facility] [address] [place], [place], [address] Phone: [phone]   Fax:  [phone]  Name: Joseph Richards MRN: [id number] Date of Birth: 11-14-1945

## 2019-05-03 ENCOUNTER — Other Ambulatory Visit: Payer: Self-pay

## 2019-05-03 ENCOUNTER — Ambulatory Visit: Payer: Medicare Other

## 2019-05-03 DIAGNOSIS — R2689 Other abnormalities of gait and mobility: Secondary | ICD-10-CM

## 2019-05-03 DIAGNOSIS — M6281 Muscle weakness (generalized): Secondary | ICD-10-CM

## 2019-05-03 NOTE — Therapy (Signed)
[facility] [address] [place], [place], [address] Phone: [phone]   Fax:  [phone]  Physical Therapy Treatment/ recert  Patient Details  Name: Joseph Richards MRN: [id number] Date of Birth: [birth year] Referring Provider (PT): Letta Pate Luanna Salk, MD   Encounter Date: 05/03/2019  PT End of Session - 05/03/19 1021    Visit Number  15    Number of Visits  16    Date for PT Re-Evaluation  07/02/2019    Authorization Type  [employer] MCR, progress note at 38, KX at 15    PT Start Time  1016    PT Stop Time  1059    PT Time Calculation (min)  43 min    Equipment Utilized During Treatment  Gait belt    Activity Tolerance  Patient tolerated treatment well    Behavior During Therapy  [facility] for tasks assessed/performed       Past Medical History:  Diagnosis Date  . Dystonia   . GERD (gastroesophageal reflux disease)   . Hyperlipidemia   . Hypertension     Past Surgical History:  Procedure Laterality Date  . HERNIA REPAIR      There were no vitals filed for this visit.  Subjective Assessment - 05/03/19 1021    Subjective  Pt reports he is doing ok. Did some exercises today. Still having some difficulty getting right foot up.    Pertinent History  Focal dystonia Rt LE,  Neurologic gait disorder    How long can you stand comfortably?  10 min    How long can you walk comfortably?  5 min using SPC    Diagnostic tests  MRI of the brain, cervical spine, lumbar spine and thoracic spine.  These are all relatively normal. he EMG/NCV of the lower extremities was normal as well.  Extensive blood work looking for autoimmune causes was similarly negative.    Patient Stated Goals  walk better, improve balance, endurance, strength    Currently in Pain?  No/denies    Pain Onset  Yesterday                       [facility] Adult PT Treatment/Exercise - 05/03/19 1023      Transfers   Transfers  Sit to Stand;Stand to  Sit    Sit to Stand  6: Modified independent (Device/Increase time)    Stand to Sit  6: Modified independent (Device/Increase time)      Ambulation/Gait   Ambulation/Gait  Yes    Ambulation/Gait Assistance  5: Supervision    Ambulation/Gait Assistance Details  wearing R foot up brace    Ambulation Distance (Feet)  230 Feet    Assistive device  Straight cane    Gait Pattern  Step-through pattern;Decreased hip/knee flexion - right;Decreased dorsiflexion - right    Ambulation Surface  Level;Indoor    Gait Comments  Pt with decreased foot clearance on right today despite foot up brace and verbal cues to increase hip flexion      Standardized Balance Assessment   Standardized Balance Assessment  Berg Balance Test;Dynamic Gait Index      Berg Balance Test   Sit to Stand  Able to stand without using hands and stabilize independently    Standing Unsupported  Able to stand safely 2 minutes    Sitting with Back Unsupported but Feet Supported on Floor or Stool  Able to sit safely and securely 2 minutes    Stand  to Sit  Sits safely with minimal use of hands    Transfers  Able to transfer safely, minor use of hands    Standing Unsupported with Eyes Closed  Able to stand 10 seconds safely    Standing Ubsupported with Feet Together  Able to place feet together independently and stand 1 minute safely    From Standing, Reach Forward with Outstretched Arm  Can reach confidently >25 cm (10")    From Standing Position, Pick up Object from Floor  Able to pick up shoe safely and easily    From Standing Position, Turn to Look Behind Over each Shoulder  Looks behind from both sides and weight shifts well    Turn 360 Degrees  Needs close supervision or verbal cueing    Standing Unsupported, Alternately Place Feet on Step/Stool  Able to complete >2 steps/needs minimal assist    Standing Unsupported, One Foot in Front  Able to take small step independently and hold 30 seconds    Standing on One Leg  Tries to  lift leg/unable to hold 3 seconds but remains standing independently    Total Score  45      Dynamic Gait Index   Level Surface  Mild Impairment    Change in Gait Speed  Moderate Impairment    Gait with Horizontal Head Turns  Mild Impairment    Gait with Vertical Head Turns  Mild Impairment    Gait and Pivot Turn  Mild Impairment    Step Over Obstacle  Mild Impairment    Step Around Obstacles  Mild Impairment    Steps  Moderate Impairment   alternate going up step-to down with right rail   Total Score  14             PT Education - 05/03/19 1622    Education Details  Pt to continue with current HEP. Discussed POC    Person(s) Educated  Patient;Spouse    Methods  Explanation    Comprehension  Verbalized understanding       PT Short Term Goals - 04/17/19 1925      PT SHORT TERM GOAL #1   Title  pt will be I and compliant with initial HEP. (Target for STG 4 weeks 04/07/19)    Baseline  Pt has been working on initial HEP    Status  Achieved      PT [place] #2   Title  Pt will improve BERG balance to >40 to show improved balance    Baseline  37    Status  Achieved        PT Long Term Goals - 05/03/19 1037      PT LONG TERM GOAL #1   Title  Pt will improve BERG balance >45 to show improved balance.    Baseline  45/56 on 05/03/2019    Time  8    Period  Weeks    Status  Partially Met      PT LONG TERM GOAL #2   Title  Pt will be able to ambulate at least 750 feet mod I with LRAD with improved Rt foot clearance.    Baseline  Supervision x 750 ft, due to decreased R foot clearance (pt has not yet received his R foot-up brace)    Time  8    Period  Weeks    Status  Partially Met      PT LONG TERM GOAL #3   Title  Pt will  perform TUG in less than 12 sec with LRAD mod I    Baseline  15.31 sec 05/01/2019    Time  8    Period  Weeks    Status  Not Met      PT LONG TERM GOAL #4   Title  Pt will improve 6MWT by at least 50 ft for improved gait efficiency  and endurance.    Baseline  Initial 6MWT 510 ft; 05/01/2019:  6MWT 649 ft    Time  8    Period  Weeks    Status  Achieved      Updated goals: PT Short Term Goals - 05/03/19 1631      PT SHORT TERM GOAL #1   Title  STG=LTG          PT Long Term Goals - 05/03/19 1632      PT LONG TERM GOAL #1   Title  Pt will improve BERG balance >46 to show improved balance.    Baseline  45/56 on 05/03/2019    Time  4    Period  Weeks    Status  Revised    Target Date  06/08/19   date extended due to delay in scheduling     PT LONG TERM GOAL #2   Title  Pt will be able to ambulate at least 750 feet mod I with LRAD with improved Rt foot clearance.    Baseline  Supervision x 750 ft, due to decreased R foot clearance (pt has not yet received his R foot-up brace)    Time  4    Period  Weeks    Status  On-going    Target Date  06/08/19      PT LONG TERM GOAL #3   Title  Pt will increase DGI from 14 to 16 or more for improved gait and balance.    Baseline  14/24 on 05/03/2019    Time  4    Period  Weeks    Status  New    Target Date  06/08/19          Plan - 05/03/19 1624    Clinical Impression Statement  Pt's balance was assessed today. Pt increased Berg Balance score to 45/56 putting him right on cusp for high fall risk and just short of goal. Pt scored 14/24 on DGI which does indicate high fall risk as well. Pt had decreased right foot clearance with gait today and is still at supervision level due to this. Will be getting new foot-up brace for Christmas. Pt will benefit from continued PT to continue to progress strength, balance and gait.    Personal Factors and Comorbidities  Comorbidity 1;Time since onset of injury/illness/exacerbation    Comorbidities  focal dystonia Rt LE since 2016    Examination-Activity Limitations  Bend;Carry;Squat;Stairs;Stand;Lift;Locomotion Level    Examination-Participation Restrictions  Meal Prep;Cleaning;Community Activity;Driving;Laundry;Yard Work     [employer]chant [employer] officer  Evolving/Moderate complexity    Rehab Potential  Fair    PT Frequency  2x / week    PT Duration  --   for 1 week followed by 1x/week for 3 weeks   PT Treatment/Interventions  ADLs/Self Care Home Management;Aquatic Therapy;Electrical Stimulation;DME Instruction;Gait training;Stair training;Functional mobility training;Therapeutic activities;Therapeutic exercise;Balance training;Neuromuscular re-education;Manual techniques;Orthotic Fit/Training;Passive range of motion;Dry needling    PT Next Visit Plan  Gait training with foot-up brace, balance, dynamic gait activities.   wk of 12/21 is week 8 of 8 in POC   PT Home Exercise  Plan  HEP: Power 4 for parkinsons X 20 reps each, 2X a day, Ankle DF, INV, EV, with yellow 2X 10 ea    Consulted and Agree with Plan of Care  Patient;Family member/caregiver    Family Member Consulted  wife       Patient will benefit from skilled therapeutic intervention in order to improve the following deficits and impairments:  Abnormal gait, Decreased activity tolerance, Decreased balance, Decreased coordination, Decreased range of motion, Decreased strength, Difficulty walking, Impaired flexibility, Postural dysfunction  Visit Diagnosis: Other abnormalities of gait and mobility  Muscle weakness (generalized)     Problem List Patient Active Problem List   Diagnosis Date Noted  . 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 Neuropathy 08/11/2016    Electa Sniff, PT, DPT, NCS 05/03/2019, 4:29 PM  [facility] [address] [place], [place], [address] Phone: [phone]   Fax:  [phone]  Name: YING ROCKS MRN: [id number] Date of Birth: 23-Dec-1945

## 2019-05-08 ENCOUNTER — Ambulatory Visit: Payer: Medicare Other

## 2019-05-08 ENCOUNTER — Other Ambulatory Visit: Payer: Self-pay

## 2019-05-08 DIAGNOSIS — R2689 Other abnormalities of gait and mobility: Secondary | ICD-10-CM | POA: Diagnosis not present

## 2019-05-08 DIAGNOSIS — M6281 Muscle weakness (generalized): Secondary | ICD-10-CM

## 2019-05-08 NOTE — Therapy (Signed)
[facility] [address] [place], [place], [address] Phone: [phone]   Fax:  [phone]  Physical Therapy Treatment  Patient Details  Name: Joseph Richards MRN: [id number] Date of Birth: 1945/10/17 Referring Provider (PT): Wynn Banker Victorino Sparrow, MD   Encounter Date: 05/08/2019  PT End of Session - 05/08/19 1020    Visit Number  16    Number of Visits  20    Date for PT Re-Evaluation  07/02/19    Authorization Type  [employer] MCR, progress note at 10, KX at 15    PT Start Time  1016    PT Stop Time  1056    PT Time Calculation (min)  40 min    Equipment Utilized During Treatment  Gait belt    Activity Tolerance  Patient tolerated treatment well    Behavior During Therapy  [facility] for tasks assessed/performed       Past Medical History:  Diagnosis Date  . Dystonia   . GERD (gastroesophageal reflux disease)   . Hyperlipidemia   . Hypertension     Past Surgical History:  Procedure Laterality Date  . HERNIA REPAIR      There were no vitals filed for this visit.  Subjective Assessment - 05/08/19 1022    Subjective  Pt reports that he is doing well. Got the foot up brace for Christmas but forgot to bring. He also got some pool noodles to work on stepping over.    Pertinent History  Focal dystonia Rt LE,  Neurologic gait disorder    How long can you stand comfortably?  10 min    How long can you walk comfortably?  5 min using SPC    Diagnostic tests  MRI of the brain, cervical spine, lumbar spine and thoracic spine.  These are all relatively normal. he EMG/NCV of the lower extremities was normal as well.  Extensive blood work looking for autoimmune causes was similarly negative.    Patient Stated Goals  walk better, improve balance, endurance, strength    Pain Onset  Yesterday                       [facility] Adult PT Treatment/Exercise - 05/08/19 1031      Ambulation/Gait   Ambulation/Gait  Yes    Ambulation/Gait Assistance  5: Supervision    Ambulation/Gait Assistance Details  Verbal cues to try to pick up more from hip    Ambulation Distance (Feet)  300 Feet    Assistive device  Straight cane    Gait Pattern  Step-through pattern;Decreased hip/knee flexion - right;Decreased dorsiflexion - right    Ambulation Surface  Level;Indoor    Gait Comments  Pt ambulated 115' with SPC after dynamic gait activities working on increasing hip flexion. Pt was cued to try to continue like he was marching some on right. Pt with initial carryover but quickly reverted back to decreased right foot clearance. Pt ambulated another 36' with SPC at end of session.      Neuro Re-ed    Neuro Re-ed Details   Pt performed dynamic gait activities in // bars: marching with 1 UE support  6' x 6, then stepping reciprocally over 3 foams with light UE support x 8 laps, large steps in // bars with 1 UE support 6' x 6. Seated rest break then patient performed step-ups as below. Then marching gait in // bars with 2# ankle weight on right 6' x 6.  Exercises   Exercises  Other Exercises    Other Exercises   Step-ups on 4" step 10 x 2 forward and x 10 lateral right with 1 UE support. Verbal cues to straighten all the way when going up.             PT Education - 05/08/19 1548    Education Details  Pt to continue with current HEP    Person(s) Educated  Patient    Methods  Explanation    Comprehension  Verbalized understanding       PT Short Term Goals - 05/03/19 1631      PT SHORT TERM GOAL #1   Title  STG=LTG        PT Long Term Goals - 05/03/19 1632      PT LONG TERM GOAL #1   Title  Pt will improve BERG balance >46 to show improved balance.    Baseline  45/56 on 05/03/2019    Time  4    Period  Weeks    Status  Revised    Target Date  06/08/19   date extended due to delay in scheduling     PT LONG TERM GOAL #2   Title  Pt will be able to ambulate at least 750 feet mod I with LRAD with  improved Rt foot clearance.    Baseline  Supervision x 750 ft, due to decreased R foot clearance (pt has not yet received his R foot-up brace)    Time  4    Period  Weeks    Status  On-going    Target Date  06/08/19      PT LONG TERM GOAL #3   Title  Pt will increase DGI from 14 to 16 or more for improved gait and balance.    Baseline  14/24 on 05/03/2019    Time  4    Period  Weeks    Status  New    Target Date  06/08/19            Plan - 05/08/19 1549    Clinical Impression Statement  Pt was able to demonstrate increased hip flexion with dynamic gait activities today in // bars and with 2# ankle weights. Carryover limited once outside // bars with less hip flexion and right foot drag at times due to decreased DF.    Personal Factors and Comorbidities  Comorbidity 1;Time since onset of injury/illness/exacerbation    Comorbidities  focal dystonia Rt LE since 2016    Examination-Activity Limitations  Bend;Carry;Squat;Stairs;Stand;Lift;Locomotion Level    Examination-Participation Restrictions  Meal Prep;Cleaning;Community Activity;Driving;Laundry;Yard Work    Conservation officer, historic buildingstability/Clinical Decision Making  Evolving/Moderate complexity    Rehab Potential  Fair    PT Frequency  2x / week    PT Duration  --   for 1 week followed by 1x/week for 3 weeks   PT Treatment/Interventions  ADLs/Self Care Home Management;Aquatic Therapy;Electrical Stimulation;DME Instruction;Gait training;Stair training;Functional mobility training;Therapeutic activities;Therapeutic exercise;Balance training;Neuromuscular re-education;Manual techniques;Orthotic Fit/Training;Passive range of motion;Dry needling    PT Next Visit Plan  Gait training with foot-up brace, balance, dynamic gait activities.   wk of 12/21 is week 8 of 8 in POC   PT Home Exercise Plan  HEP: Power 4 for parkinsons X 20 reps each, 2X a day, Ankle DF, INV, EV, with yellow 2X 10 ea    Consulted and Agree with Plan of Care  Patient;Family  member/caregiver    Family Member Consulted  wife  Patient will benefit from skilled therapeutic intervention in order to improve the following deficits and impairments:  Abnormal gait, Decreased activity tolerance, Decreased balance, Decreased coordination, Decreased range of motion, Decreased strength, Difficulty walking, Impaired flexibility, Postural dysfunction  Visit Diagnosis: Muscle weakness (generalized)  Other abnormalities of gait and mobility     Problem List Patient Active Problem List   Diagnosis Date Noted  . 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 Neuropathy 08/11/2016    Electa Sniff, PT, DPT, NCS 05/08/2019, 3:50 PM  [facility] [address] [place], [place], [address] Phone: [phone]   Fax:  [phone]  Name: DANTE ROUDEBUSH MRN: [id number] Date of Birth: 07-13-1945

## 2019-05-10 ENCOUNTER — Other Ambulatory Visit: Payer: Self-pay

## 2019-05-10 ENCOUNTER — Ambulatory Visit: Payer: Medicare Other

## 2019-05-10 DIAGNOSIS — R2689 Other abnormalities of gait and mobility: Secondary | ICD-10-CM

## 2019-05-10 DIAGNOSIS — M6281 Muscle weakness (generalized): Secondary | ICD-10-CM

## 2019-05-10 NOTE — Therapy (Signed)
[facility] [address] [place], [place], [address] Phone: [phone]   Fax:  [phone]  Physical Therapy Treatment  Patient Details  Name: Joseph Richards MRN: [id number] Date of Birth: January 09, 1946 Referring Provider (Joseph Richards): Wynn BankerKirsteins, Victorino SparrowAndrew E, MD   Encounter Date: 05/10/2019  Joseph Richards End of Session - 05/10/19 1018    Visit Number  17    Number of Visits  20    Date for Joseph Richards Re-Evaluation  07/02/19    Authorization Type  [employer] MCR, progress note at 10, KX at 15    Joseph Richards Start Time  1016    Joseph Richards Stop Time  1057    Joseph Richards Time Calculation (min)  41 min    Equipment Utilized During Treatment  Gait belt    Activity Tolerance  Patient tolerated treatment well    Behavior During Therapy  [facility] for tasks assessed/performed       Past Medical History:  Diagnosis Date  . Dystonia   . GERD (gastroesophageal reflux disease)   . Hyperlipidemia   . Hypertension     Past Surgical History:  Procedure Laterality Date  . HERNIA REPAIR      There were no vitals filed for this visit.  Subjective Assessment - 05/10/19 1017    Subjective  Joseph Richards reports he is doing ok. Did bring new foot-up brace. Reports did try at home but didn't seem to stay tight after awhile with current shoes.    Pertinent History  Focal dystonia Rt LE,  Neurologic gait disorder    How long can you stand comfortably?  10 min    How long can you walk comfortably?  5 min using SPC    Diagnostic tests  MRI of the brain, cervical spine, lumbar spine and thoracic spine.  These are all relatively normal. he EMG/NCV of the lower extremities was normal as well.  Extensive blood work looking for autoimmune causes was similarly negative.    Patient Stated Goals  walk better, improve balance, endurance, strength    Currently in Pain?  No/denies    Pain Onset  Yesterday                       [facility] Adult Joseph Richards Treatment/Exercise - 05/10/19 1021      Transfers   Transfers  Sit to Stand;Stand to Sit    Sit to Stand  6: Modified independent (Device/Increase time)    Stand to Sit  6: Modified independent (Device/Increase time)      Ambulation/Gait   Ambulation/Gait  Yes    Ambulation/Gait Assistance  5: Supervision;6: Modified independent (Device/Increase time)    Ambulation/Gait Assistance Details  Joseph Richards was given verbal cues to increase right hip flexion with gait and really think about it.    Ambulation Distance (Feet)  345 Feet    Assistive device  Straight cane    Gait Pattern  Step-through pattern;Decreased hip/knee flexion - right    Ambulation Surface  Level;Indoor    Gait Comments  Joseph Richards was able to keep toes cleared on right.  Joseph Richards ambulated outside with [facility] 400' supervision. Joseph Richards was given verbal cues to stop and stand a couple times to regroup as steps became shorter and started to flex more with feet getting behind him with more right foot drag. Foot up brace did help with toe clearance. Joseph Richards ambulated on paved sidewalk with slight incline/decline at times. Joseph Richards spoke with patient and wife about importance of always using cane  even in house as Joseph Richards furniture walking in home. Joseph Richards agreed to try to get separate cane to keep inside. He will also plan to keep foot-up brace in shoe to utilize more. Joseph Richards positioned on lowest lace on curent Sperry type shoes and double knotted lace to help secure in place.      Neuro Re-ed    Neuro Re-ed Details   In // bars: marching gait over 3 different sized foams with  1 UE support x 6 laps foward then x 3 laps sidestepping. Stepping over 4 cones for increased hip height x 4 laps. Joseph Richards was able to show good foot clearance with more hip flexion with stepping over activities.             Joseph Richards Education - 05/10/19 1106    Education Details  Education on foot-up brace, marching activities along counter over his pool noodles that wife purchased.    Person(s) Educated  Patient;Spouse    Methods  Explanation    Comprehension   Verbalized understanding;Returned demonstration       Joseph Richards Short Term Goals - 05/03/19 1631      Joseph Richards SHORT TERM GOAL #1   Title  STG=LTG        Joseph Richards Long Term Goals - 05/03/19 1632      Joseph Richards LONG TERM GOAL #1   Title  Joseph Richards will improve BERG balance >46 to show improved balance.    Baseline  45/56 on 05/03/2019    Time  4    Period  Weeks    Status  Revised    Target Date  06/08/19   date extended due to delay in scheduling     Joseph Richards LONG TERM GOAL #2   Title  Joseph Richards will be able to ambulate at least 750 feet mod I with LRAD with improved Rt foot clearance.    Baseline  Supervision x 750 ft, due to decreased R foot clearance (Joseph Richards has not yet received his R foot-up brace)    Time  4    Period  Weeks    Status  On-going    Target Date  06/08/19      Joseph Richards LONG TERM GOAL #3   Title  Joseph Richards will increase DGI from 14 to 16 or more for improved gait and balance.    Baseline  14/24 on 05/03/2019    Time  4    Period  Weeks    Status  New    Target Date  06/08/19            Plan - 05/10/19 1107    Clinical Impression Statement  Joseph Richards was able to get better toe clearance with new foot-up brace of his. He does, however, lack right hip flexion during gait so getting decreased right foot clearance especially as fatigues.    Personal Factors and Comorbidities  Comorbidity 1;Time since onset of injury/illness/exacerbation    Comorbidities  focal dystonia Rt LE since 2016    Examination-Activity Limitations  Bend;Carry;Squat;Stairs;Stand;Lift;Locomotion Level    Examination-Participation Restrictions  Meal Prep;Cleaning;Community Activity;Driving;Laundry;Yard Work    Conservation officer, historic buildings  Evolving/Moderate complexity    Rehab Potential  Fair    Joseph Richards Frequency  2x / week    Joseph Richards Duration  --   for 1 week followed by 1x/week for 3 weeks   Joseph Richards Treatment/Interventions  ADLs/Self Care Home Management;Aquatic Therapy;Electrical Stimulation;DME Instruction;Gait training;Stair training;Functional  mobility training;Therapeutic activities;Therapeutic exercise;Balance training;Neuromuscular re-education;Manual techniques;Orthotic Fit/Training;Passive range of motion;Dry needling    Joseph Richards  Next Visit Plan  Gait training with foot-up brace, balance, dynamic gait activities with emphasis on increased right hip flexion.   wk of 12/21 is week 8 of 8 in POC   Joseph Richards Home Exercise Plan  HEP: Power 4 for parkinsons X 20 reps each, 2X a day, Ankle DF, INV, EV, with yellow 2X 10 ea    Consulted and Agree with Plan of Care  Patient;Family member/caregiver    Family Member Consulted  wife       Patient will benefit from skilled therapeutic intervention in order to improve the following deficits and impairments:  Abnormal gait, Decreased activity tolerance, Decreased balance, Decreased coordination, Decreased range of motion, Decreased strength, Difficulty walking, Impaired flexibility, Postural dysfunction  Visit Diagnosis: Muscle weakness (generalized)  Other abnormalities of gait and mobility     Problem List Patient Active Problem List   Diagnosis Date Noted  . 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 Neuropathy 08/11/2016    Joseph Richards, Joseph Richards, Joseph Richards, Joseph Richards 05/10/2019, 11:10 AM  [facility] [address] [place], [place], [address] Phone: [phone]   Fax:  [phone]  Name: Joseph Richards MRN: [id number] Date of Birth: January 18, 1946

## 2019-05-16 ENCOUNTER — Other Ambulatory Visit: Payer: Self-pay

## 2019-05-16 ENCOUNTER — Encounter: Payer: Medicare PPO | Attending: Physical Medicine & Rehabilitation | Admitting: Physical Medicine & Rehabilitation

## 2019-05-16 ENCOUNTER — Encounter: Payer: Self-pay | Admitting: Physical Medicine & Rehabilitation

## 2019-05-16 VITALS — BP 147/87 | HR 74 | Temp 97.7°F | Ht 68.0 in | Wt 188.0 lb

## 2019-05-16 DIAGNOSIS — G248 Other dystonia: Secondary | ICD-10-CM | POA: Diagnosis not present

## 2019-05-16 NOTE — Patient Instructions (Signed)
Will increase dose of Botox to 300U add extra dose to gastrosoleus

## 2019-05-16 NOTE — Progress Notes (Signed)
Subjective:    Patient ID: Joseph Richards, male    DOB: 07/23/1945, 74 y.o.   MRN: [id number]  HPI  6 wk f/u post Botox RLE for focal dystonia  RIGHT Post tib 75U FDL 75U Lateral gastroc 50U  Per wife standing up straighter with walking, working on proper cane use in Physical   Foot turning in significantly,   PT reduced from 2 x per week to once a week.    Pain Inventory Average Pain 7 Pain Right Now 0 My pain is burning  In the last 24 hours, has pain interfered with the following? General activity 7 Relation with others 3 Enjoyment of life 7 What TIME of day is your pain at its worst? evening Sleep (in general) Fair  Pain is worse with: na Pain improves with: na Relief from Meds: na  Mobility walk without assistance use a cane do you drive?  yes  Function retired  Neuro/Psych trouble walking  Prior Studies Any changes since last visit?  no  Physicians involved in your care Any changes since last visit?  no   Family History  Problem Relation Age of Onset  . Aneurysm Mother        brain  . Lung cancer Father   . Heart attack Father   . Breast cancer Sister   . Healthy Daughter    Social History   Socioeconomic History  . Marital status: Married    Spouse name: Not on file  . Number of children: Not on file  . Years of education: Not on file  . Highest education level: Not on file  Occupational History  . Occupation: retired    Comment: Multimedia programmer  Tobacco Use  . Smoking status: Former Smoker    Quit date: 09/29/1982    Years since quitting: 36.6  . Smokeless tobacco: Current User    Types: Chew  Substance and Sexual Activity  . Alcohol use: Yes    Comment: 1 drink every couple months  . Drug use: No  . Sexual activity: Yes    Partners: Female  Other Topics Concern  . Not on file  Social History Narrative   Left handed      Lives with wife   Social Determinants of Health   Financial Resource Strain:   .  Difficulty of Paying Living Expenses: Not on file  Food Insecurity:   . Worried About Charity fundraiser in the Last Year: Not on file  . Ran Out of Food in the Last Year: Not on file  Transportation Needs:   . Lack of Transportation (Medical): Not on file  . Lack of Transportation (Non-Medical): Not on file  Physical Activity:   . Days of Exercise per Week: Not on file  . Minutes of Exercise per Session: Not on file  Stress:   . Feeling of Stress : Not on file  Social Connections:   . Frequency of Communication with Friends and Family: Not on file  . Frequency of Social Gatherings with Friends and Family: Not on file  . Attends Religious Services: Not on file  . Active Member of Clubs or Organizations: Not on file  . Attends Archivist Meetings: Not on file  . Marital Status: Not on file   Past Surgical History:  Procedure Laterality Date  . HERNIA REPAIR     Past Medical History:  Diagnosis Date  . Dystonia   . GERD (gastroesophageal reflux disease)   . Hyperlipidemia   .  Hypertension    BP (!) 147/87   Pulse 74   Temp 97.7 F (36.5 C)   Ht 5\' 8"  (1.727 m)   Wt 188 lb (85.3 kg)   SpO2 97%   BMI 28.59 kg/m   Opioid Risk Score:   Fall Risk Score:  `1  Depression screen PHQ 2/9  Depression screen PHQ 2/9 08/30/2018  Decreased Interest 0  Down, Depressed, Hopeless 0  PHQ - 2 Score 0     Review of Systems  Constitutional: Negative.   HENT: Negative.   Eyes: Negative.   Respiratory: Negative.   Cardiovascular: Negative.   Gastrointestinal: Negative.   Endocrine: Negative.   Genitourinary: Negative.   Musculoskeletal: Positive for gait problem.  Skin: Negative.   Allergic/Immunologic: Negative.   Hematological: Negative.   Psychiatric/Behavioral: Negative.   All other systems reviewed and are negative.      Objective:   Physical Exam Vitals and nursing note reviewed.  Constitutional:      Appearance: Normal appearance.  Eyes:      Extraocular Movements: Extraocular movements intact.     Conjunctiva/sclera: Conjunctivae normal.     Pupils: Pupils are equal, round, and reactive to light.  Neurological:     Mental Status: He is alert and oriented to person, place, and time.     Comments: Patient has some slowed responses otherwise appears at baseline. He ambulates without assistive device he does use the foot up orthosis.  Foot is plantigrade.  He does have toe drag on the right side. Motor strength is 4/5 in the right ankle dorsiflexor plantar flexor foot inverters and 4 - at the foot evertor Tone no evidence of clonus at the ankle. There is increased toe flexion during standing of the second through fifth digits.  No excess tone at the hallux Heel cord is tight on the right side.  Psychiatric:        Mood and Affect: Mood normal.        Behavior: Behavior normal.           Assessment & Plan:  #1.  Focal dystonia right foot and ankle.  He has done well in achieving plantigrade with botulinum toxin primarily to the posterior tibialis and FDL.  He still has some toe drag which actually improves with the shoe off. Will add additional botulinum toxin to the gastrosoleus to help mitigate the toe drag and reduce fall risk  Will increase dose of Botox to 300U add extra dose to gastrosoleus RIGHT Post tib 75U FDL 75U Lateral gastroc 50U, soleus 50 Medial gastroc 50U, Soleus  50U

## 2019-05-18 ENCOUNTER — Ambulatory Visit: Payer: Medicare PPO | Attending: Physical Medicine & Rehabilitation

## 2019-05-18 ENCOUNTER — Other Ambulatory Visit: Payer: Self-pay

## 2019-05-18 DIAGNOSIS — M6281 Muscle weakness (generalized): Secondary | ICD-10-CM

## 2019-05-18 DIAGNOSIS — R2689 Other abnormalities of gait and mobility: Secondary | ICD-10-CM | POA: Insufficient documentation

## 2019-05-18 NOTE — Therapy (Signed)
[facility] [address] [place], [place], [address] Phone: [phone]   Fax:  [phone]  Physical Therapy Treatment  Patient Details  Name: Joseph Richards MRN: [id number] Date of Birth: 1945-10-26 Referring Provider (PT): Wynn Banker Victorino Sparrow, MD   Encounter Date: 05/18/2019  PT End of Session - 05/18/19 1410    Visit Number  18    Number of Visits  20    Date for PT Re-Evaluation  07/02/19    PT Start Time  1400    PT Stop Time  1446    PT Time Calculation (min)  46 min       Past Medical History:  Diagnosis Date  . Dystonia   . GERD (gastroesophageal reflux disease)   . Hyperlipidemia   . Hypertension     Past Surgical History:  Procedure Laterality Date  . HERNIA REPAIR      There were no vitals filed for this visit.  Subjective Assessment - 05/18/19 1411    Subjective  Pt saw Dr. Wynn Banker yesterday and they will return in February to inject botox in right lower leg again. Pt reports that he will also be doing some in posterior calf this time. Pt reports that he is tired as swept out garage earlier.    Pertinent History  Focal dystonia Rt LE,  Neurologic gait disorder    How long can you stand comfortably?  10 min    How long can you walk comfortably?  5 min using SPC    Diagnostic tests  MRI of the brain, cervical spine, lumbar spine and thoracic spine.  These are all relatively normal. he EMG/NCV of the lower extremities was normal as well.  Extensive blood work looking for autoimmune causes was similarly negative.    Patient Stated Goals  walk better, improve balance, endurance, strength    Currently in Pain?  No/denies    Pain Onset  Yesterday                       [facility] Adult PT Treatment/Exercise - 05/18/19 1437      Ambulation/Gait   Ambulation/Gait  Yes    Ambulation/Gait Assistance  5: Supervision    Ambulation/Gait Assistance Details  Pt was given verbal cues to  increase right hip flexion    Ambulation Distance (Feet)  230 Feet    Assistive device  Straight cane   right foot up brace   Gait Pattern  Step-through pattern;Decreased hip/knee flexion - right;Decreased dorsiflexion - right    Ambulation Surface  Level;Indoor    Ramp  5: Supervision    Ramp Details (indicate cue type and reason)  with SPC    Curb  4: Min assist    Curb Details (indicate cue type and reason)  with SPC. Performed x 5. Pt nervous with performing with instability but did improve as practiced with verbal cues for sequencing.      Neuro Re-ed    Neuro Re-ed Details   Along counter: marching gait 6' x 8, sidestepping with red theraband around thighs to work on hip ER and abduction with adding in squat as side stepping 6' x 4. Standing on airex marching in place with fingertip support x 10. Standing on airex with manual pertubations x 1 min. Standing on ramp racing up maintaining balance x 30 sec with manual pertubations, marching x 10 on ramp. CGA for safety. Pt able to maintain more erect posture throughout.  PT Education - 05/18/19 2021    Education Details  Pt to continue with current HEP    Person(s) Educated  Patient    Methods  Explanation    Comprehension  Verbalized understanding       PT Short Term Goals - 05/03/19 1631      PT SHORT TERM GOAL #1   Title  STG=LTG        PT Long Term Goals - 05/03/19 1632      PT LONG TERM GOAL #1   Title  Pt will improve BERG balance >46 to show improved balance.    Baseline  45/56 on 05/03/2019    Time  4    Period  Weeks    Status  Revised    Target Date  06/08/19   date extended due to delay in scheduling     PT LONG TERM GOAL #2   Title  Pt will be able to ambulate at least 750 feet mod I with LRAD with improved Rt foot clearance.    Baseline  Supervision x 750 ft, due to decreased R foot clearance (pt has not yet received his R foot-up brace)    Time  4    Period  Weeks    Status  On-going     Target Date  06/08/19      PT LONG TERM GOAL #3   Title  Pt will increase DGI from 14 to 16 or more for improved gait and balance.    Baseline  14/24 on 05/03/2019    Time  4    Period  Weeks    Status  New    Target Date  06/08/19            Plan - 05/18/19 2022    Clinical Impression Statement  Pt reporting being more fatigued upon arrival today as had done a lot prior to coming. Pt is showing more upright posture with activities with less knee flexion. Does have decreased right foot clearance as tone more pronounced with gait.    Personal Factors and Comorbidities  Comorbidity 1;Time since onset of injury/illness/exacerbation    Comorbidities  focal dystonia Rt LE since 2016    Examination-Activity Limitations  Bend;Carry;Squat;Stairs;Stand;Lift;Locomotion Level    Examination-Participation Restrictions  Meal Prep;Cleaning;Community Activity;Driving;Laundry;Yard Work    Merchant navy officer  Evolving/Moderate complexity    Rehab Potential  Fair    PT Frequency  2x / week    PT Duration  --   for 1 week followed by 1x/week for 3 weeks   PT Treatment/Interventions  ADLs/Self Care Home Management;Aquatic Therapy;Electrical Stimulation;DME Instruction;Gait training;Stair training;Functional mobility training;Therapeutic activities;Therapeutic exercise;Balance training;Neuromuscular re-education;Manual techniques;Orthotic Fit/Training;Passive range of motion;Dry needling    PT Next Visit Plan  Gait training with foot-up brace, balance, dynamic gait activities with emphasis on increased right hip flexion.   wk of 12/21 is week 8 of 8 in POC   PT Home Exercise Plan  HEP: Power 4 for parkinsons X 20 reps each, 2X a day, Ankle DF, INV, EV, with yellow 2X 10 ea    Consulted and Agree with Plan of Care  Patient;Family member/caregiver    Family Member Consulted  wife       Patient will benefit from skilled therapeutic intervention in order to improve the following  deficits and impairments:  Abnormal gait, Decreased activity tolerance, Decreased balance, Decreased coordination, Decreased range of motion, Decreased strength, Difficulty walking, Impaired flexibility, Postural dysfunction  Visit Diagnosis: Muscle weakness (generalized)  Other abnormalities of gait and mobility     Problem List Patient Active Problem List   Diagnosis Date Noted  . 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 Neuropathy 08/11/2016    Ronn Melena, PT, DPT, NCS 05/18/2019, 8:24 PM  [facility] [address] [place], [place], [address] Phone: [phone]   Fax:  [phone]  Name: CAINEN BURNHAM MRN: [id number] Date of Birth: 05/24/1945

## 2019-05-25 ENCOUNTER — Other Ambulatory Visit: Payer: Self-pay

## 2019-05-25 ENCOUNTER — Ambulatory Visit: Payer: Medicare PPO

## 2019-05-25 DIAGNOSIS — M6281 Muscle weakness (generalized): Secondary | ICD-10-CM | POA: Diagnosis not present

## 2019-05-25 DIAGNOSIS — R2689 Other abnormalities of gait and mobility: Secondary | ICD-10-CM

## 2019-05-25 NOTE — Therapy (Signed)
[facility] [address] [place], [place], [address] Phone: [phone]   Fax:  [phone]  Physical Therapy Treatment  Patient Details  Name: Joseph Richards MRN: [id number] Date of Birth: 07/13/1945 Referring Provider (PT): Letta Pate Luanna Salk, MD   Encounter Date: 05/25/2019  PT End of Session - 05/25/19 1104    Visit Number  19    Number of Visits  20    Date for PT Re-Evaluation  07/02/19    PT Start Time  1101    PT Stop Time  1146    PT Time Calculation (min)  45 min    Equipment Utilized During Treatment  Gait belt    Activity Tolerance  Patient tolerated treatment well    Behavior During Therapy  [facility] for tasks assessed/performed       Past Medical History:  Diagnosis Date  . Dystonia   . GERD (gastroesophageal reflux disease)   . Hyperlipidemia   . Hypertension     Past Surgical History:  Procedure Laterality Date  . HERNIA REPAIR      There were no vitals filed for this visit.  Subjective Assessment - 05/25/19 1104    Subjective  Pt reports he has been doing pretty good but does find he has some weakness as day progresses. Does stretching before gets going which helps some.    Pertinent History  Focal dystonia Rt LE,  Neurologic gait disorder    How long can you stand comfortably?  10 min    How long can you walk comfortably?  5 min using SPC    Diagnostic tests  MRI of the brain, cervical spine, lumbar spine and thoracic spine.  These are all relatively normal. he EMG/NCV of the lower extremities was normal as well.  Extensive blood work looking for autoimmune causes was similarly negative.    Patient Stated Goals  walk better, improve balance, endurance, strength    Currently in Pain?  No/denies    Pain Onset  [name] Adult PT Treatment/Exercise - 05/25/19 1106      Ambulation/Gait   Ambulation/Gait  Yes    Ambulation/Gait Assistance  5:  Supervision    Ambulation/Gait Assistance Details  Pt was instructed to focus on right foot placement like he is trying to step over something for more hip flexion    Ambulation Distance (Feet)  750 Feet    Assistive device  Straight cane   right foot up brace   Gait Pattern  Step-through pattern;Decreased hip/knee flexion - right;Decreased dorsiflexion - right    Ambulation Surface  Level;Indoor    Curb  5: Supervision;4: Min assist    Curb Details (indicate cue type and reason)  with SPC performed x 4 reps. Pt likes to lead up with right LE and down with left which is oppposite one would think. Unsteady at times with descent but able to correct with only close SBA/CGA    Gait Comments  6 min walk distance was the 750' with [facility]      Standardized Balance Assessment   Standardized Balance Assessment  Dynamic Gait Index      Berg Balance Test   Sit to Stand  Able to stand without using hands and stabilize independently    Standing Unsupported  Able to stand safely 2 minutes    Sitting with Back Unsupported but  Feet Supported on Floor or Stool  Able to sit safely and securely 2 minutes    Stand to Sit  Sits safely with minimal use of hands    Transfers  Able to transfer safely, minor use of hands    Standing Unsupported with Eyes Closed  Able to stand 10 seconds safely    Standing Ubsupported with Feet Together  Able to place feet together independently and stand 1 minute safely    From Standing, Reach Forward with Outstretched Arm  Can reach confidently >25 cm (10")    From Standing Position, Pick up Object from Floor  Able to pick up shoe safely and easily    From Standing Position, Turn to Look Behind Over each Shoulder  Looks behind from both sides and weight shifts well    Turn 360 Degrees  Able to turn 360 degrees safely but slowly    Standing Unsupported, Alternately Place Feet on Step/Stool  Able to complete >2 steps/needs minimal assist    Standing Unsupported, One Foot in Front   Able to take small step independently and hold 30 seconds    Standing on One Leg  Tries to lift leg/unable to hold 3 seconds but remains standing independently    Total Score  46      Dynamic Gait Index   Level Surface  Mild Impairment    Change in Gait Speed  Mild Impairment    Gait with Horizontal Head Turns  Mild Impairment    Gait with Vertical Head Turns  Mild Impairment    Gait and Pivot Turn  Mild Impairment    Step Over Obstacle  Mild Impairment    Step Around Obstacles  Mild Impairment    Steps  Mild Impairment    Total Score  16             PT Education - 05/25/19 1157    Education Details  Pt to continue with current HEP    Person(s) Educated  Patient    Methods  Explanation    Comprehension  Verbalized understanding       PT Short Term Goals - 05/03/19 1631      PT SHORT TERM GOAL #1   Title  STG=LTG        PT Long Term Goals - 05/25/19 1117      PT LONG TERM GOAL #1   Title  Pt will improve BERG balance >46 to show improved balance.    Baseline  45/56 on 05/03/2019, 46/56 on 05/25/2019    Time  4    Period  Weeks    Status  Partially Met      PT LONG TERM GOAL #2   Title  Pt will be able to ambulate at least 750 feet mod I with LRAD with improved Rt foot clearance.    Baseline  Supervision x 750 ft with right foot-up brace    Time  4    Period  Weeks    Status  Partially Met      PT LONG TERM GOAL #3   Title  Pt will increase DGI from 14 to 16 or more for improved gait and balance.    Baseline  14/24 on 05/03/2019, 16/24 on DGI meeting goal on 05/25/2019    Time  4    Period  Weeks    Status  Achieved            Plan - 05/25/19 1200    Clinical Impression Statement  PT reassessed  Joseph Richards and DGI today. Minimal change on Berg. DGI did improve meting that goal. Pt was able to walk longer distance today with 6 min walk supervision with right foot-up brace. Does better with foot clearance when reminded to visualize stepping over something to  get more right hip flexion.    Personal Factors and Comorbidities  Comorbidity 1;Time since onset of injury/illness/exacerbation    Comorbidities  focal dystonia Rt LE since 2016    Examination-Activity Limitations  Bend;Carry;Squat;Stairs;Stand;Lift;Locomotion Level    Examination-Participation Restrictions  Meal Prep;Cleaning;Community Activity;Driving;Laundry;Yard Work    [employer] officer  Evolving/Moderate complexity    Rehab Potential  Fair    PT Frequency  2x / week    PT Duration  --   for 1 week followed by 1x/week for 3 weeks   PT Treatment/Interventions  ADLs/Self Care Home Management;Aquatic Therapy;Electrical Stimulation;DME Instruction;Gait training;Stair training;Functional mobility training;Therapeutic activities;Therapeutic exercise;Balance training;Neuromuscular re-education;Manual techniques;Orthotic Fit/Training;Passive range of motion;Dry needling    PT Next Visit Plan  Last visit in cert is next visit. Reassess to determine if any further need.10th visit progress note. Gait training with foot-up brace, balance, dynamic gait activities with emphasis on increased right hip flexion.   wk of 12/21 is week 8 of 8 in POC   PT Home Exercise Plan  HEP: Power 4 for parkinsons X 20 reps each, 2X a day, Ankle DF, INV, EV, with yellow 2X 10 ea    Consulted and Agree with Plan of Care  Patient;Family member/caregiver    Family Member Consulted  wife       Patient will benefit from skilled therapeutic intervention in order to improve the following deficits and impairments:  Abnormal gait, Decreased activity tolerance, Decreased balance, Decreased coordination, Decreased range of motion, Decreased strength, Difficulty walking, Impaired flexibility, Postural dysfunction  Visit Diagnosis: Muscle weakness (generalized)  Other abnormalities of gait and mobility     Problem List Patient Active Problem List   Diagnosis Date Noted  . 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 Neuropathy 08/11/2016    Electa Sniff, PT, DPT, NCS 05/25/2019, 12:09 PM  [facility] [address] [place], [place], [address] Phone: [phone]   Fax:  [phone]  Name: Joseph Richards MRN: [id number] Date of Birth: 06-06-1945

## 2019-06-01 ENCOUNTER — Ambulatory Visit: Payer: Medicare PPO

## 2019-06-01 ENCOUNTER — Other Ambulatory Visit: Payer: Self-pay

## 2019-06-01 DIAGNOSIS — M6281 Muscle weakness (generalized): Secondary | ICD-10-CM

## 2019-06-01 DIAGNOSIS — R2689 Other abnormalities of gait and mobility: Secondary | ICD-10-CM

## 2019-06-01 NOTE — Therapy (Signed)
[facility] [address] [place], [place], [address] Phone: [phone]   Fax:  [phone]  Physical Therapy Treatment/10th visit progress/Discharge summary  Patient Details  Name: Joseph Richards MRN: [id number] Date of Birth: May 07, 1946 Referring Provider (PT): Charlett Blake, MD    Progress Note  Reporting period 04/20/19 to 06/01/19  See Note below for Objective Data and Assessment of Progress/Goals  PHYSICAL THERAPY DISCHARGE SUMMARY  Visits from Start of Care: 20  Current functional level related to goals / functional outcomes: See clinical impression and goals for more information. Pt was seen by PT to work on strength, balance and gait due to right leg dystonia with abnormal gait. Pt is ambulating mod I with SPC shorter, level surfaces and supervision level for longer distances. Pt obtained right foot-up brace to assist with toe clearance throughout course of therapy. Still has to focus on increasing right hip flexion with gait. Posture in standing and with gait overall has improved with less flexed stance.   Remaining deficits: Dystonia right leg which affects stability with gait   Education / Equipment: HEP  Plan: Patient agrees to discharge.  Patient goals were partially met. Patient is being discharged due to meeting the stated rehab goals.  ?????        Encounter Date: 06/01/2019  PT End of Session - 06/01/19 1018    Visit Number  20    Number of Visits  20    Date for PT Re-Evaluation  07/02/19    PT Start Time  2703    PT Stop Time  1057    PT Time Calculation (min)  42 min    Equipment Utilized During Treatment  Gait belt    Activity Tolerance  Patient tolerated treatment well    Behavior During Therapy  WFL for tasks assessed/performed       Past Medical History:  Diagnosis Date  . Dystonia   . GERD (gastroesophageal reflux disease)   . Hyperlipidemia   . Hypertension      Past Surgical History:  Procedure Laterality Date  . HERNIA REPAIR      There were no vitals filed for this visit.  Subjective Assessment - 06/01/19 1019    Subjective  Pt reports that he is moving a little slower today. Wife got him ankle weights.    Pertinent History  Focal dystonia Rt LE,  Neurologic gait disorder    How long can you stand comfortably?  10 min    How long can you walk comfortably?  5 min using SPC    Diagnostic tests  MRI of the brain, cervical spine, lumbar spine and thoracic spine.  These are all relatively normal. he EMG/NCV of the lower extremities was normal as well.  Extensive blood work looking for autoimmune causes was similarly negative.    Patient Stated Goals  walk better, improve balance, endurance, strength    Currently in Pain?  Yes    Pain Location  --   thighs   Pain Orientation  Right;Left    Pain Descriptors / Indicators  Sore;Tightness    Pain Type  Acute pain    Pain Onset  Yesterday    Aggravating Factors   had been squatting working on bird feeder yesterday                       [facility] Adult PT Treatment/Exercise - 06/01/19 1053      Ambulation/Gait   Ambulation/Gait  Yes    Ambulation/Gait Assistance  6: Modified independent (Device/Increase time)    Ambulation/Gait Assistance Details  Pt was instructed to increase right hip flexion/visualize stepping over object to help with foot clearance. Pt had foot-up brace on and also 3# ankle weight.    Ambulation Distance (Feet)  115 Feet    Assistive device  Straight cane    Gait Pattern  Step-through pattern;Decreased hip/knee flexion - right;Poor foot clearance - right    Ambulation Surface  Level;Indoor    Gait Comments  Pt was able to increase right foot clearance today with focus on increasing hip flexion with weight donned.      Exercises   Exercises  Other Exercises    Other Exercises   PT reviewed HEP and updated/educated on progression options. See below listing of  exercises.        Exercises Long Sitting Isometric Ankle Eversion in Dorsiflexion with Ball at [place] - 10 reps with PT providing resistance to push against Seated Toe Raise - 10 reps  Standing Gastroc Stretch - 1 reps - 1 sets - 30 sec hold with cues for form Seated Hamstring Stretch - 3 reps - 1 sets - 30 sec hold -discussed and demonstrated proper form Supine Quadriceps Stretch with Strap on Table -discussed and reviewed looking at picture Supine Bridge - reviewed Clamshell - 10 reps with verbal cues for form. Advised he could add band for resistance. Standing Alternating Knee Flexion with Ankle Weights - 10 reps with 3# ankle weight x 10 right leg Walking March - 6' x 2 with 3# ankle weight and SPC/counter support. Discussed patient could use his noodles to step over at home as well.      PT Education - 06/01/19 1305    Education Details  Reviewed HEP and updated with new handout    Person(s) Educated  Patient;Spouse    Methods  Explanation;Demonstration;Handout    Comprehension  Verbalized understanding;Returned demonstration       PT Short Term Goals - 05/03/19 1631      PT SHORT TERM GOAL #1   Title  STG=LTG        PT Long Term Goals - 06/01/19 1306      PT LONG TERM GOAL #1   Title  Pt will improve BERG balance >46 to show improved balance.    Baseline  45/56 on 05/03/2019, 46/56 on 05/25/2019. Pt was 1 point short of meeting goal    Time  4    Period  Weeks    Status  Not Met      PT LONG TERM GOAL #2   Title  Pt will be able to ambulate at least 750 feet mod I with LRAD with improved Rt foot clearance.    Baseline  Supervision x 750 ft with right foot-up brace    Time  4    Period  Weeks    Status  Partially Met      PT LONG TERM GOAL #3   Title  Pt will increase DGI from 14 to 16 or more for improved gait and balance.    Baseline  14/24 on 05/03/2019, 16/24 on DGI meeting goal on 05/25/2019    Time  4    Period  Weeks    Status  Achieved             Plan - 06/01/19 1309    Clinical Impression Statement  PT focused on reviewing HEP and updating to allow pt to progress  as able at home. Pt did well with exercises. Able to improve right hip flexion with gait when really thinks about it. Foot-up brace is helping with toe clearance aspect. Pt able to walk shorter distances mod I with SPC but longer distances and outside still at more supervision level. Pt has shown improvement in posture throughout therapy with much more upright posture in standing and with gait. PT discharging patient to home program today.    Personal Factors and Comorbidities  Comorbidity 1;Time since onset of injury/illness/exacerbation    Comorbidities  focal dystonia Rt LE since 2016    Examination-Activity Limitations  Bend;Carry;Squat;Stairs;Stand;Lift;Locomotion Level    Examination-Participation Restrictions  Meal Prep;Cleaning;Community Activity;Driving;Laundry;Yard Work    [employer] officer  Evolving/Moderate complexity    Rehab Potential  Fair    PT Frequency  2x / week    PT Duration  --   for 1 week followed by 1x/week for 3 weeks   PT Treatment/Interventions  ADLs/Self Care Home Management;Aquatic Therapy;Electrical Stimulation;DME Instruction;Gait training;Stair training;Functional mobility training;Therapeutic activities;Therapeutic exercise;Balance training;Neuromuscular re-education;Manual techniques;Orthotic Fit/Training;Passive range of motion;Dry needling    PT Next Visit Plan  Discharged today   wk of 12/21 is week 8 of 8 in POC   PT Home Exercise Plan  HEP: Power 4 for parkinsons X 20 reps each, 2X a day, Ankle DF, INV, EV, with yellow 2X 10 ea    Consulted and Agree with Plan of Care  Patient;Family member/caregiver    Family Member Consulted  wife       Patient will benefit from skilled therapeutic intervention in order to improve the following deficits and impairments:  Abnormal gait, Decreased activity tolerance,  Decreased balance, Decreased coordination, Decreased range of motion, Decreased strength, Difficulty walking, Impaired flexibility, Postural dysfunction  Visit Diagnosis: Muscle weakness (generalized)  Other abnormalities of gait and mobility     Problem List Patient Active Problem List   Diagnosis Date Noted  . 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 Neuropathy 08/11/2016    Electa Sniff, PT, DPT, NCS 06/01/2019, 1:13 PM  [facility] [address] [place], [place], [address] Phone: [phone]   Fax:  [phone]  Name: Joseph Richards MRN: [id number] Date of Birth: 07-30-1945

## 2019-06-01 NOTE — Patient Instructions (Signed)
Access Code: [email or web address]  URL: [email or web address]  Date: 06/01/2019  Prepared by: Elmer Bales   Exercises Long Sitting Isometric Ankle Eversion in Dorsiflexion with Ball at Wall - 10 reps - 2 sets - 1x daily - 5x weekly Seated Toe Raise - 10 reps - 2 sets - 1x daily - 5x weekly Standing Gastroc Stretch - 1 reps - 1 sets - 30 sec hold - 1x daily - 5x weekly Seated Hamstring Stretch - 3 reps - 1 sets - 30 sec hold - 1x daily - 5x weekly Supine Quadriceps Stretch with Strap on Table - 3 reps - 1 sets - 30 sec hold - 1-2x daily - 7x weekly Supine Bridge - 10 reps - 1-2 sets - 1x daily - 5x weekly Clamshell - 10 reps - 2 sets - 2x daily - 5x weekly Standing Alternating Knee Flexion with Ankle Weights - 10 reps - 2 sets - 1x daily - 5x weekly Walking March - 10 reps - 3 sets - 1x daily - 7x weekly

## 2019-06-05 ENCOUNTER — Encounter: Payer: Self-pay | Admitting: Podiatry

## 2019-06-05 ENCOUNTER — Ambulatory Visit (INDEPENDENT_AMBULATORY_CARE_PROVIDER_SITE_OTHER): Payer: Medicare PPO

## 2019-06-05 ENCOUNTER — Ambulatory Visit: Payer: Medicare PPO | Admitting: Podiatry

## 2019-06-05 ENCOUNTER — Other Ambulatory Visit: Payer: Self-pay

## 2019-06-05 VITALS — BP 121/72

## 2019-06-05 DIAGNOSIS — M21371 Foot drop, right foot: Secondary | ICD-10-CM

## 2019-06-05 DIAGNOSIS — M2012 Hallux valgus (acquired), left foot: Secondary | ICD-10-CM

## 2019-06-08 ENCOUNTER — Ambulatory Visit: Payer: Medicare PPO

## 2019-06-12 ENCOUNTER — Other Ambulatory Visit: Payer: Self-pay

## 2019-06-12 ENCOUNTER — Ambulatory Visit: Payer: Medicare PPO | Admitting: Orthotics

## 2019-06-12 DIAGNOSIS — M21371 Foot drop, right foot: Secondary | ICD-10-CM

## 2019-06-12 DIAGNOSIS — R269 Unspecified abnormalities of gait and mobility: Secondary | ICD-10-CM

## 2019-06-12 DIAGNOSIS — M2012 Hallux valgus (acquired), left foot: Secondary | ICD-10-CM

## 2019-06-12 NOTE — Progress Notes (Addendum)
Patient presents today for evaluation/casting for AFO brace (r).   Patient has hx of the following conditions: Gait instability,  Ankle instabilty,  Gait analysis done and patient displays abnormality of gait in both sagittial and frontal planes, and could benefit in aggressive ankle support.  Patient chose [place] brace w/ lace/speed laces.  

## 2019-06-13 NOTE — Progress Notes (Signed)
   HPI: 74 y.o. male presenting today as a new patient for evaluation of bunion deformity to the right foot.  The development of the bunion has gradually occurred over the past year.  Is very painful when it rubs on shoes.  Aggravated by walking.  He has not tried any treatments for it at the moment.  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  He presents for further treatment and evaluation  Past Medical History:  Diagnosis Date  . Dystonia   . GERD (gastroesophageal reflux disease)   . Hyperlipidemia   . Hypertension      Physical Exam: General: The patient is alert and oriented x3 in no acute distress.  Dermatology: Skin is warm, dry and supple bilateral lower extremities. Negative for open lesions or macerations.  Vascular: Palpable pedal pulses bilaterally. No edema or erythema noted. Capillary refill within normal limits.  Neurological: Epicritic and protective threshold grossly intact bilaterally.   Musculoskeletal Exam: Range of motion within normal limits to all pedal and ankle joints bilateral. Muscle strength 5/5 in all groups bilateral with exception of dorsiflexion to the right lower extremity secondary to dropfoot.  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  There is some limited range of motion to the first MTPJ and lateral deviation of the hallux.  Radiographic Exam:  Normal osseous mineralization. Joint spaces preserved. No fracture/dislocation/boney destruction.  Increased intermetatarsal angle greater than 15 degrees with a hallux abductus angle greater than 30 degrees.  There is some degenerative changes also noted to the first MTPJ  Assessment: 1.  Dropfoot RLE 2.  Bunion deformity left   Plan of Care:  1. Patient evaluated. X-Rays reviewed.  2.  I do recommend for this patient conservative treatment only.  I do not recommend surgical intervention.  Conservative treatment would include wide fitting shoes to alleviate pressure  from the bunion deformity. 3.  We are also going to set up an appointment with Raiford Noble, Pedorthist for an AFO or [place] brace to alleviate the dropfoot deformity.  At the moment the patient wears a small elastic clip to the anterior aspect of the ankle to help alleviate with dorsiflexion but this is not enough to support the dropfoot and he is at risk for falls. 4.  Return to clinic as needed      Felecia Shelling, DPM [facility]  Dr. Felecia Shelling, DPM    [address] [place], [place] [address]                Office [phone]  Fax [phone]

## 2019-06-27 ENCOUNTER — Encounter: Payer: Medicare PPO | Admitting: Physical Medicine & Rehabilitation

## 2019-06-28 ENCOUNTER — Other Ambulatory Visit: Payer: Medicare PPO | Admitting: Orthotics

## 2019-06-28 ENCOUNTER — Other Ambulatory Visit: Payer: Self-pay

## 2019-06-28 DIAGNOSIS — M21371 Foot drop, right foot: Secondary | ICD-10-CM | POA: Diagnosis not present

## 2019-08-01 ENCOUNTER — Other Ambulatory Visit: Payer: Self-pay

## 2019-08-01 ENCOUNTER — Ambulatory Visit: Payer: Medicare PPO | Admitting: Orthotics

## 2019-08-01 DIAGNOSIS — M2012 Hallux valgus (acquired), left foot: Secondary | ICD-10-CM

## 2019-08-01 DIAGNOSIS — M21371 Foot drop, right foot: Secondary | ICD-10-CM

## 2019-08-01 DIAGNOSIS — R269 Unspecified abnormalities of gait and mobility: Secondary | ICD-10-CM

## 2019-08-01 NOTE — Progress Notes (Signed)
Gave advise about shoe selection for brace and bunion.

## 2019-09-11 ENCOUNTER — Ambulatory Visit: Payer: Medicare PPO | Admitting: Podiatry

## 2019-09-11 ENCOUNTER — Other Ambulatory Visit: Payer: Self-pay

## 2019-09-11 DIAGNOSIS — M21371 Foot drop, right foot: Secondary | ICD-10-CM | POA: Diagnosis not present

## 2019-09-11 DIAGNOSIS — M2012 Hallux valgus (acquired), left foot: Secondary | ICD-10-CM | POA: Diagnosis not present

## 2019-09-11 DIAGNOSIS — R269 Unspecified abnormalities of gait and mobility: Secondary | ICD-10-CM

## 2019-09-11 NOTE — Patient Instructions (Signed)
Pre-Operative Instructions  Congratulations, you have decided to take an important step towards improving your quality of life.  You can be assured that the doctors and staff at [facility] will be with you every step of the way.  Here are some important things you should know:  1. Plan to be at the surgery center/hospital at least 1 (one) hour prior to your scheduled time, unless otherwise directed by the surgical center/hospital staff.  You must have a responsible adult accompany you, remain during the surgery and drive you home.  Make sure you have directions to the surgical center/hospital to ensure you arrive on time. 2. If you are having surgery at [facility] or [facility] hospitals, you will need a copy of your medical history and physical form from your family physician within one month prior to the date of surgery. We will give you a form for your primary physician to complete.  3. We make every effort to accommodate the date you request for surgery.  However, there are times where surgery dates or times have to be moved.  We will contact you as soon as possible if a change in schedule is required.   4. No aspirin/ibuprofen for one week before surgery.  If you are on aspirin, any non-steroidal anti-inflammatory medications (Mobic, Aleve, Ibuprofen) should not be taken seven (7) days prior to your surgery.  You make take Tylenol for pain prior to surgery.  5. Medications - If you are taking daily heart and blood pressure medications, seizure, reflux, allergy, asthma, anxiety, pain or diabetes medications, make sure you notify the surgery center/hospital before the day of surgery so they can tell you which medications you should take or avoid the day of surgery. 6. No food or drink after midnight the night before surgery unless directed otherwise by surgical center/hospital staff. 7. No alcoholic beverages 24-hours prior to surgery.  No smoking 24-hours prior or 24-hours after  surgery. 8. Wear loose pants or shorts. They should be loose enough to fit over bandages, boots, and casts. 9. Don't wear slip-on shoes. Sneakers are preferred. 10. Bring your boot with you to the surgery center/hospital.  Also bring crutches or a walker if your physician has prescribed it for you.  If you do not have this equipment, it will be provided for you after surgery. 11. If you have not been contacted by the surgery center/hospital by the day before your surgery, call to confirm the date and time of your surgery. 12. Leave-time from work may vary depending on the type of surgery you have.  Appropriate arrangements should be made prior to surgery with your employer. 13. Prescriptions will be provided immediately following surgery by your doctor.  Fill these as soon as possible after surgery and take the medication as directed. Pain medications will not be refilled on weekends and must be approved by the doctor. 14. Remove nail polish on the operative foot and avoid getting pedicures prior to surgery. 15. Wash the night before surgery.  The night before surgery wash the foot and leg well with water and the antibacterial soap provided. Be sure to pay special attention to beneath the toenails and in between the toes.  Wash for at least three (3) minutes. Rinse thoroughly with water and dry well with a towel.  Perform this wash unless told not to do so by your physician.  Enclosed: 1 Ice pack (please put in freezer the night before surgery)   1 Hibiclens skin cleaner     Pre-op instructions  If you have any questions regarding the instructions, please do not hesitate to call our office.  [place]: [address], [place], [place] [address] -- [phone]  [facility]: [address] [place], [place] [address] -- [phone]  [facility]: [address], [place], [place] [address] -- [phone]   Website: [email or web address] 

## 2019-09-13 NOTE — Progress Notes (Signed)
   HPI: 74 y.o. male presenting today for follow up evaluation of a bunion deformity of the left foot. He reports continued pain that he states is now worse. Walking increases the pain. He has been using an [place] brace and wearing larger shoes for treatment. Patient is here for further evaluation and treatment.   Past Medical History:  Diagnosis Date  . Dystonia   . GERD (gastroesophageal reflux disease)   . Hyperlipidemia   . Hypertension      Physical Exam: General: The patient is alert and oriented x3 in no acute distress.  Dermatology: Skin is warm, dry and supple bilateral lower extremities. Negative for open lesions or macerations.  Vascular: Palpable pedal pulses bilaterally. No edema or erythema noted. Capillary refill within normal limits.  Neurological: Epicritic and protective threshold grossly intact bilaterally.   Musculoskeletal Exam: Range of motion within normal limits to all pedal and ankle joints bilateral. Muscle strength 5/5 in all groups bilateral with exception of dorsiflexion to the right lower extremity secondary to dropfoot.  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  There is some limited range of motion to the first MTPJ and lateral deviation of the hallux.  Assessment: 1. Dropfoot RLE 2. Bunion deformity left   Plan of Care:  1. Patient evaluated.  2. Today we discussed the conservative versus surgical management of the presenting pathology. The patient opts for surgical management. All possible complications and details of the procedure were explained. All patient questions were answered. No guarantees were expressed or implied. 3. Authorization for surgery was initiated today. Surgery will consist of bunionectomy with osteotomy left.  4. Continue using [place] brace on the RLE.  5. Bunion cushions provided to patient.  6. Medical clearance required from PCP, Dr. Clelia Croft.  7. Return to clinic one week post op.      Felecia Shelling,  DPM [facility]  Dr. Felecia Shelling, DPM    [address] [place], [place] [address]                Office [phone]  Fax [phone]

## 2019-10-12 ENCOUNTER — Encounter: Payer: Self-pay | Admitting: Podiatry

## 2019-10-19 DIAGNOSIS — I129 Hypertensive chronic kidney disease with stage 1 through stage 4 chronic kidney disease, or unspecified chronic kidney disease: Secondary | ICD-10-CM | POA: Diagnosis not present

## 2019-10-23 ENCOUNTER — Telehealth: Payer: Self-pay

## 2019-10-23 NOTE — Telephone Encounter (Signed)
Received fax from Dr. Lupita Raider, PCP. She stated Joseph Richards is low risk for surgery though she recommended that we inform the anesthesiologist of his right lower extremity dystonia of unclear etiology. I emailed this information to Hardy with [facility]. I will also send information to be scanned into Destyn's chart.

## 2019-11-02 ENCOUNTER — Telehealth: Payer: Self-pay

## 2019-11-02 NOTE — Telephone Encounter (Signed)
DOS 11/09/2019  DOUBLE OSTECTOMY LT - 28299  COHERE AUTHORIZATION -   Procedure 28299 Correction of bunion Units n/a Diagnosis M20.12 Hallux valgus (acquired), left foot (primary) Attention [facility] Confirmation date Nov 09, 2019 - Dec 09, 2019 Member ID number [id number] Patient name Joseph Richards, Joseph Richards Patient phone number [phone] Patient date of birth 03/04/1946 Type Outpatient Ordering provider Pandora Leiter DPM Performing provider Pandora Leiter DPM Facility [facility] authorization number [id number]

## 2019-11-09 ENCOUNTER — Other Ambulatory Visit: Payer: Self-pay | Admitting: Podiatry

## 2019-11-09 DIAGNOSIS — M25572 Pain in left ankle and joints of left foot: Secondary | ICD-10-CM | POA: Diagnosis not present

## 2019-11-09 DIAGNOSIS — M2012 Hallux valgus (acquired), left foot: Secondary | ICD-10-CM | POA: Diagnosis not present

## 2019-11-09 DIAGNOSIS — M21612 Bunion of left foot: Secondary | ICD-10-CM | POA: Diagnosis not present

## 2019-11-09 MED ORDER — OXYCODONE-ACETAMINOPHEN 5-325 MG PO TABS: 1.0000 | ORAL_TABLET | Freq: Four times a day (QID) | ORAL | 0 refills | Status: DC | PRN

## 2019-11-09 MED ORDER — IBUPROFEN 800 MG PO TABS: 800.0000 mg | ORAL_TABLET | Freq: Three times a day (TID) | ORAL | 1 refills | Status: DC

## 2019-11-09 NOTE — Progress Notes (Signed)
PRN postop 

## 2019-11-15 ENCOUNTER — Ambulatory Visit (INDEPENDENT_AMBULATORY_CARE_PROVIDER_SITE_OTHER): Payer: Medicare PPO | Admitting: Podiatry

## 2019-11-15 ENCOUNTER — Ambulatory Visit (INDEPENDENT_AMBULATORY_CARE_PROVIDER_SITE_OTHER): Payer: Medicare PPO

## 2019-11-15 ENCOUNTER — Encounter: Payer: Self-pay | Admitting: Podiatry

## 2019-11-15 ENCOUNTER — Other Ambulatory Visit: Payer: Self-pay

## 2019-11-15 DIAGNOSIS — M2012 Hallux valgus (acquired), left foot: Secondary | ICD-10-CM | POA: Diagnosis not present

## 2019-11-15 DIAGNOSIS — Z9889 Other specified postprocedural states: Secondary | ICD-10-CM

## 2019-11-15 NOTE — Progress Notes (Signed)
   Subjective:  Patient presents today status post bunionectomy with double osteotomy left foot. DOS: 11/09/2019.  Patient states he is doing well with minimal pain.  He denies nausea vomiting chills fever or shortness of breath.  He has been minimally weightbearing in the cam boot using a walker.  No new complaints at this time.  He presents for further treatment and evaluation  Past Medical History:  Diagnosis Date  . Dystonia   . GERD (gastroesophageal reflux disease)   . Hyperlipidemia   . Hypertension       Objective/Physical Exam Neurovascular status intact.  Skin incisions appear to be well coapted with staples intact. No sign of infectious process noted. No dehiscence. No active bleeding noted. Moderate edema noted to the surgical extremity.  Radiographic Exam:  Orthopedic hardware and osteotomies sites appear to be stable with routine healing.  Assessment: 1. s/p bunionectomy with double osteotomy left. DOS: 11/09/2019   Plan of Care:  1. Patient was evaluated. X-rays reviewed 2.  Dressings changed today.  Keep clean dry and intact x1 week 3.  Continue minimal weightbearing in the cam boot with the assistance of a walker 4.  Return to clinic in 1 week for suture removal   Felecia Shelling, DPM [facility]  Dr. Felecia Shelling, DPM    [address]                                        [place], [place] [address]                Office [phone]  Fax [phone]

## 2019-11-22 ENCOUNTER — Encounter: Payer: Self-pay | Admitting: Podiatry

## 2019-11-22 ENCOUNTER — Other Ambulatory Visit: Payer: Self-pay

## 2019-11-22 ENCOUNTER — Ambulatory Visit (INDEPENDENT_AMBULATORY_CARE_PROVIDER_SITE_OTHER): Payer: Medicare PPO | Admitting: Podiatry

## 2019-11-22 DIAGNOSIS — Z9889 Other specified postprocedural states: Secondary | ICD-10-CM

## 2019-11-22 DIAGNOSIS — M2012 Hallux valgus (acquired), left foot: Secondary | ICD-10-CM

## 2019-11-24 NOTE — Progress Notes (Signed)
   Subjective:  Patient presents today status post bunionectomy with double osteotomy left foot. DOS: 11/09/2019.  Patient states that he is doing well.  There is some tightness possibly due to the compression wrap that was applied.  Overall he states he is doing great.  No new complaints at this time.  He has been minimally weightbearing in the cam boot as directed.  He is also using a walker for assistance.  Past Medical History:  Diagnosis Date  . Dystonia   . GERD (gastroesophageal reflux disease)   . Hyperlipidemia   . Hypertension       Objective/Physical Exam Neurovascular status intact.  Skin incisions appear to be well coapted with staples intact. No sign of infectious process noted. No dehiscence. No active bleeding noted. Moderate edema noted to the surgical extremity.  Assessment: 1. s/p bunionectomy with double osteotomy left. DOS: 11/09/2019   Plan of Care:  1. Patient was evaluated.  2.  Staples removed today. 3.  Ace wrap applied.  Recommend Ace wrap daily 4.  Continue weightbearing in the cam boot with the assistance of a walker 5.  Return to clinic in 3 weeks  Felecia Shelling, DPM [facility]  Dr. Felecia Shelling, DPM    [address]                                        [place], [place] [address]                Office [phone]  Fax [phone]

## 2019-12-11 ENCOUNTER — Other Ambulatory Visit: Payer: Self-pay

## 2019-12-11 ENCOUNTER — Ambulatory Visit (INDEPENDENT_AMBULATORY_CARE_PROVIDER_SITE_OTHER): Payer: Medicare PPO | Admitting: Podiatry

## 2019-12-11 DIAGNOSIS — M2012 Hallux valgus (acquired), left foot: Secondary | ICD-10-CM

## 2019-12-11 DIAGNOSIS — Z9889 Other specified postprocedural states: Secondary | ICD-10-CM

## 2019-12-11 NOTE — Progress Notes (Signed)
   Subjective:  Patient presents today status post bunionectomy with double osteotomy left foot. DOS: 11/09/2019.  Patient states that he is doing very well.  He has been weightbearing in the cam boot as instructed.  No new complaints at this time.  He has minimal pain associated to the surgical site.  Past Medical History:  Diagnosis Date  . Dystonia   . GERD (gastroesophageal reflux disease)   . Hyperlipidemia   . Hypertension       Objective/Physical Exam Neurovascular status intact.  Skin incisions appear to be well coapted and healed. No sign of infectious process noted. No dehiscence. No active bleeding noted.  No edema noted to the surgical foot.  There is some limited range of motion to the first MTPJ likely due to scar tissue adhesion and slight dorsiflexion of the hallux at the level of the MTPJ  Assessment: 1. s/p bunionectomy with double osteotomy left. DOS: 11/09/2019   Plan of Care:  1. Patient was evaluated.  2.  Recommend daily range of motion exercises to the first MTPJ to break up scar tissue and help plantarflex the toe 3.  Patient may discontinue cam boot.  Postoperative shoe dispensed today.  Weightbearing as tolerated. 4.  Unfortunately x-ray system was down secondary to IT issues.  Return to clinic in 3 weeks for follow-up x-ray and to transition the patient possibly into good supportive sneakers  Felecia Shelling, DPM [facility]  Dr. Felecia Shelling, DPM    [address]                                        [place], [place] [address]                Office [phone]  Fax [phone]

## 2020-01-01 ENCOUNTER — Ambulatory Visit (INDEPENDENT_AMBULATORY_CARE_PROVIDER_SITE_OTHER): Payer: Medicare PPO

## 2020-01-01 ENCOUNTER — Ambulatory Visit (INDEPENDENT_AMBULATORY_CARE_PROVIDER_SITE_OTHER): Payer: Medicare PPO | Admitting: Podiatry

## 2020-01-01 ENCOUNTER — Other Ambulatory Visit: Payer: Self-pay

## 2020-01-01 DIAGNOSIS — M2012 Hallux valgus (acquired), left foot: Secondary | ICD-10-CM

## 2020-01-01 DIAGNOSIS — Z9889 Other specified postprocedural states: Secondary | ICD-10-CM

## 2020-01-01 NOTE — Addendum Note (Signed)
Addended by: Baldomero Lamy A on: 01/01/2020 04:21 PM   Modules accepted: Orders

## 2020-01-01 NOTE — Progress Notes (Signed)
   Subjective:  Patient presents today status post bunionectomy with double osteotomy left foot. DOS: 11/09/2019.  Patient is doing very well.  He does continue to have some swelling to the surgical foot.  Otherwise no new complaints at this time.  Minimal pain.  He does also state that he has some numbness to the medial aspect of the incision site.  Past Medical History:  Diagnosis Date  . Dystonia   . GERD (gastroesophageal reflux disease)   . Hyperlipidemia   . Hypertension       Objective/Physical Exam Neurovascular status intact.  There is some paresthesia noted to the medial aspect of the first MTPJ of the surgical foot.  Skin incisions appear to be well coapted and healed. No sign of infectious process noted. No dehiscence. No active bleeding noted.  Today there is some moderate edema noted to the surgical foot.  There is some limited range of motion to the first MTPJ likely due to scar tissue adhesion and slight dorsiflexion of the hallux at the level of the MTPJ  Radiographic exam Osteotomy sites appear healing routinely.  There is some movement of the orthopedic screws within the first metatarsal and some periarticular callus formation however the first ray is in decent alignment.  There is some shortening noted.  Assessment: 1. s/p bunionectomy with double osteotomy left. DOS: 11/09/2019   Plan of Care:  1. Patient was evaluated.  2.  Patient may discontinue postsurgical shoe.  Recommend good supportive sneakers 3.  Compression ankle sleeve dispensed.  Wear daily 4.  Order placed for physical therapy at [facility] 5.  Return to clinic in 2 months for final follow-up and x-ray  Felecia Shelling, DPM [facility]  Dr. Felecia Shelling, DPM    [address]                                        [place], [place] [address]                Office [phone]  Fax [phone]

## 2020-01-05 DIAGNOSIS — M25652 Stiffness of left hip, not elsewhere classified: Secondary | ICD-10-CM | POA: Diagnosis not present

## 2020-01-05 DIAGNOSIS — R269 Unspecified abnormalities of gait and mobility: Secondary | ICD-10-CM | POA: Diagnosis not present

## 2020-01-05 DIAGNOSIS — M6281 Muscle weakness (generalized): Secondary | ICD-10-CM | POA: Diagnosis not present

## 2020-01-05 DIAGNOSIS — R2681 Unsteadiness on feet: Secondary | ICD-10-CM | POA: Diagnosis not present

## 2020-01-05 DIAGNOSIS — M25651 Stiffness of right hip, not elsewhere classified: Secondary | ICD-10-CM | POA: Diagnosis not present

## 2020-01-12 DIAGNOSIS — M25651 Stiffness of right hip, not elsewhere classified: Secondary | ICD-10-CM | POA: Diagnosis not present

## 2020-01-12 DIAGNOSIS — R2681 Unsteadiness on feet: Secondary | ICD-10-CM | POA: Diagnosis not present

## 2020-01-12 DIAGNOSIS — M6281 Muscle weakness (generalized): Secondary | ICD-10-CM | POA: Diagnosis not present

## 2020-01-12 DIAGNOSIS — R269 Unspecified abnormalities of gait and mobility: Secondary | ICD-10-CM | POA: Diagnosis not present

## 2020-01-12 DIAGNOSIS — M25652 Stiffness of left hip, not elsewhere classified: Secondary | ICD-10-CM | POA: Diagnosis not present

## 2020-01-17 DIAGNOSIS — R2681 Unsteadiness on feet: Secondary | ICD-10-CM | POA: Diagnosis not present

## 2020-01-17 DIAGNOSIS — M25651 Stiffness of right hip, not elsewhere classified: Secondary | ICD-10-CM | POA: Diagnosis not present

## 2020-01-17 DIAGNOSIS — M6281 Muscle weakness (generalized): Secondary | ICD-10-CM | POA: Diagnosis not present

## 2020-01-17 DIAGNOSIS — M25652 Stiffness of left hip, not elsewhere classified: Secondary | ICD-10-CM | POA: Diagnosis not present

## 2020-01-17 DIAGNOSIS — R269 Unspecified abnormalities of gait and mobility: Secondary | ICD-10-CM | POA: Diagnosis not present

## 2020-01-19 DIAGNOSIS — R2681 Unsteadiness on feet: Secondary | ICD-10-CM | POA: Diagnosis not present

## 2020-01-19 DIAGNOSIS — M25652 Stiffness of left hip, not elsewhere classified: Secondary | ICD-10-CM | POA: Diagnosis not present

## 2020-01-19 DIAGNOSIS — M25651 Stiffness of right hip, not elsewhere classified: Secondary | ICD-10-CM | POA: Diagnosis not present

## 2020-01-19 DIAGNOSIS — R269 Unspecified abnormalities of gait and mobility: Secondary | ICD-10-CM | POA: Diagnosis not present

## 2020-01-19 DIAGNOSIS — M6281 Muscle weakness (generalized): Secondary | ICD-10-CM | POA: Diagnosis not present

## 2020-02-03 DIAGNOSIS — Z23 Encounter for immunization: Secondary | ICD-10-CM | POA: Diagnosis not present

## 2020-02-28 DIAGNOSIS — N183 Chronic kidney disease, stage 3 unspecified: Secondary | ICD-10-CM | POA: Diagnosis not present

## 2020-02-28 DIAGNOSIS — I129 Hypertensive chronic kidney disease with stage 1 through stage 4 chronic kidney disease, or unspecified chronic kidney disease: Secondary | ICD-10-CM | POA: Diagnosis not present

## 2020-02-28 DIAGNOSIS — L858 Other specified epidermal thickening: Secondary | ICD-10-CM | POA: Diagnosis not present

## 2020-02-28 DIAGNOSIS — D485 Neoplasm of uncertain behavior of skin: Secondary | ICD-10-CM | POA: Diagnosis not present

## 2020-02-28 DIAGNOSIS — E782 Mixed hyperlipidemia: Secondary | ICD-10-CM | POA: Diagnosis not present

## 2020-03-04 ENCOUNTER — Ambulatory Visit (INDEPENDENT_AMBULATORY_CARE_PROVIDER_SITE_OTHER): Payer: Medicare PPO | Admitting: Podiatry

## 2020-03-04 ENCOUNTER — Ambulatory Visit (INDEPENDENT_AMBULATORY_CARE_PROVIDER_SITE_OTHER): Payer: Medicare PPO

## 2020-03-04 ENCOUNTER — Other Ambulatory Visit: Payer: Self-pay

## 2020-03-04 DIAGNOSIS — M21371 Foot drop, right foot: Secondary | ICD-10-CM

## 2020-03-04 DIAGNOSIS — R269 Unspecified abnormalities of gait and mobility: Secondary | ICD-10-CM

## 2020-03-04 DIAGNOSIS — M2012 Hallux valgus (acquired), left foot: Secondary | ICD-10-CM

## 2020-03-04 DIAGNOSIS — Z9889 Other specified postprocedural states: Secondary | ICD-10-CM

## 2020-03-04 NOTE — Progress Notes (Signed)
   Subjective:  Patient presents today status post bunionectomy with double osteotomy left foot. DOS: 11/09/2019.  The patient states that he is doing very well and has minimal pain.  He has been weightbearing in good supportive sneakers.  No new complaints at this time  Past Medical History:  Diagnosis Date  . Dystonia   . GERD (gastroesophageal reflux disease)   . Hyperlipidemia   . Hypertension       Objective/Physical Exam Neurovascular status intact.  There is some paresthesia noted to the medial aspect of the first MTPJ of the surgical foot.  Skin incisions appear to be well coapted and healed. No sign of infectious process noted. No dehiscence. No active bleeding noted.  Today there is some moderate edema noted to the surgical foot.  There is some limited range of motion to the first MTPJ likely due to scar tissue adhesion and slight dorsiflexion of the hallux at the level of the MTPJ  Radiographic exam Continued movement of the first metatarsal with periarticular callus formation noted and shortening of the first metatarsal also noted.  The osteotomy to the proximal phalanx appears to be healing routinely.  Assessment: 1. s/p bunionectomy with double osteotomy left. DOS: 11/09/2019   Plan of Care:  1. Patient was evaluated.  2.  Continue wearing good supportive sneakers 3.  Continue compression ankle sleeve as needed 4.  Patient may discontinue physical therapy.  Patient states that he is not getting much out of the physical therapy. 5.  Recommend daily range of motion exercises especially plantar flexion to the first MPJ of the surgical foot.  The range of motion exercises were demonstrated today and the patient demonstrates good understanding 6.  Return to clinic in 2 months for final follow-up x-ray  Felecia Shelling, DPM [facility]  Dr. Felecia Shelling, DPM    [address] [place], [place] [address]                Office  [phone]  Fax [phone]

## 2020-03-15 DIAGNOSIS — R2681 Unsteadiness on feet: Secondary | ICD-10-CM | POA: Diagnosis not present

## 2020-03-15 DIAGNOSIS — M6281 Muscle weakness (generalized): Secondary | ICD-10-CM | POA: Diagnosis not present

## 2020-03-15 DIAGNOSIS — M25652 Stiffness of left hip, not elsewhere classified: Secondary | ICD-10-CM | POA: Diagnosis not present

## 2020-03-15 DIAGNOSIS — M25651 Stiffness of right hip, not elsewhere classified: Secondary | ICD-10-CM | POA: Diagnosis not present

## 2020-03-15 DIAGNOSIS — R269 Unspecified abnormalities of gait and mobility: Secondary | ICD-10-CM | POA: Diagnosis not present

## 2020-03-22 DIAGNOSIS — R2681 Unsteadiness on feet: Secondary | ICD-10-CM | POA: Diagnosis not present

## 2020-03-22 DIAGNOSIS — M25652 Stiffness of left hip, not elsewhere classified: Secondary | ICD-10-CM | POA: Diagnosis not present

## 2020-03-22 DIAGNOSIS — M25651 Stiffness of right hip, not elsewhere classified: Secondary | ICD-10-CM | POA: Diagnosis not present

## 2020-03-22 DIAGNOSIS — R269 Unspecified abnormalities of gait and mobility: Secondary | ICD-10-CM | POA: Diagnosis not present

## 2020-03-22 DIAGNOSIS — M6281 Muscle weakness (generalized): Secondary | ICD-10-CM | POA: Diagnosis not present

## 2020-03-26 DIAGNOSIS — H16223 Keratoconjunctivitis sicca, not specified as Sjogren's, bilateral: Secondary | ICD-10-CM | POA: Diagnosis not present

## 2020-03-29 DIAGNOSIS — M25652 Stiffness of left hip, not elsewhere classified: Secondary | ICD-10-CM | POA: Diagnosis not present

## 2020-03-29 DIAGNOSIS — R2681 Unsteadiness on feet: Secondary | ICD-10-CM | POA: Diagnosis not present

## 2020-03-29 DIAGNOSIS — M25651 Stiffness of right hip, not elsewhere classified: Secondary | ICD-10-CM | POA: Diagnosis not present

## 2020-03-29 DIAGNOSIS — M6281 Muscle weakness (generalized): Secondary | ICD-10-CM | POA: Diagnosis not present

## 2020-03-29 DIAGNOSIS — R269 Unspecified abnormalities of gait and mobility: Secondary | ICD-10-CM | POA: Diagnosis not present

## 2020-04-12 DIAGNOSIS — M25651 Stiffness of right hip, not elsewhere classified: Secondary | ICD-10-CM | POA: Diagnosis not present

## 2020-04-12 DIAGNOSIS — M25652 Stiffness of left hip, not elsewhere classified: Secondary | ICD-10-CM | POA: Diagnosis not present

## 2020-04-12 DIAGNOSIS — R2681 Unsteadiness on feet: Secondary | ICD-10-CM | POA: Diagnosis not present

## 2020-04-12 DIAGNOSIS — R269 Unspecified abnormalities of gait and mobility: Secondary | ICD-10-CM | POA: Diagnosis not present

## 2020-04-12 DIAGNOSIS — M6281 Muscle weakness (generalized): Secondary | ICD-10-CM | POA: Diagnosis not present

## 2020-04-30 DIAGNOSIS — Z20822 Contact with and (suspected) exposure to covid-19: Secondary | ICD-10-CM | POA: Diagnosis not present

## 2020-05-08 ENCOUNTER — Ambulatory Visit: Payer: Medicare PPO | Admitting: Podiatry

## 2020-05-08 ENCOUNTER — Other Ambulatory Visit: Payer: Self-pay

## 2020-05-08 DIAGNOSIS — Z9889 Other specified postprocedural states: Secondary | ICD-10-CM

## 2020-05-08 DIAGNOSIS — M2012 Hallux valgus (acquired), left foot: Secondary | ICD-10-CM

## 2020-05-12 NOTE — Progress Notes (Signed)
   Subjective:  Patient presents today status post bunionectomy with double osteotomy left foot. DOS: 11/09/2019.  Patient states that he is doing well.  He has been wearing regular shoes.  Negative for any significant pain.  No new complaints at this time Past Medical History:  Diagnosis Date  . Dystonia   . GERD (gastroesophageal reflux disease)   . Hyperlipidemia   . Hypertension       Objective/Physical Exam Neurovascular status intact.  Shortening of the first ray noted.  There is some slight dorsiflexion of the MTPJ of the toe.  Limited range of motion noted to the first MTPJ postsurgical.  Negative for any significant pain on palpation or range of motion.  Assessment: 1. s/p bunionectomy with double osteotomy left. DOS: 11/09/2019   Plan of Care:  1. Patient was evaluated.  2.  Continue wearing good supportive sneakers 3.  From a surgical standpoint patient may resume full activity no restrictions 4.  Recommend daily range of motion exercises and plantar flexing the foot daily to break up any scar tissue adhesion in the area 5.  Return to clinic as needed  Felecia Shelling, DPM [facility]  Dr. Felecia Shelling, DPM    [address] [place], [place] [address]                Office [phone]  Fax [phone]

## 2020-10-03 DIAGNOSIS — Z125 Encounter for screening for malignant neoplasm of prostate: Secondary | ICD-10-CM | POA: Diagnosis not present

## 2020-10-03 DIAGNOSIS — N529 Male erectile dysfunction, unspecified: Secondary | ICD-10-CM | POA: Diagnosis not present

## 2020-10-03 DIAGNOSIS — G249 Dystonia, unspecified: Secondary | ICD-10-CM | POA: Diagnosis not present

## 2020-10-03 DIAGNOSIS — E782 Mixed hyperlipidemia: Secondary | ICD-10-CM | POA: Diagnosis not present

## 2020-10-03 DIAGNOSIS — K219 Gastro-esophageal reflux disease without esophagitis: Secondary | ICD-10-CM | POA: Diagnosis not present

## 2020-10-03 DIAGNOSIS — R270 Ataxia, unspecified: Secondary | ICD-10-CM | POA: Diagnosis not present

## 2020-10-03 DIAGNOSIS — Z0000 Encounter for general adult medical examination without abnormal findings: Secondary | ICD-10-CM | POA: Diagnosis not present

## 2020-10-03 DIAGNOSIS — I129 Hypertensive chronic kidney disease with stage 1 through stage 4 chronic kidney disease, or unspecified chronic kidney disease: Secondary | ICD-10-CM | POA: Diagnosis not present

## 2020-10-03 DIAGNOSIS — N183 Chronic kidney disease, stage 3 unspecified: Secondary | ICD-10-CM | POA: Diagnosis not present

## 2020-10-08 ENCOUNTER — Encounter: Payer: Self-pay | Admitting: Neurology

## 2020-10-08 ENCOUNTER — Ambulatory Visit: Payer: Medicare PPO | Admitting: Neurology

## 2020-10-08 ENCOUNTER — Other Ambulatory Visit: Payer: Self-pay

## 2020-10-08 VITALS — BP 130/85 | HR 74 | Resp 20 | Ht 66.0 in | Wt 189.0 lb

## 2020-10-08 DIAGNOSIS — R2681 Unsteadiness on feet: Secondary | ICD-10-CM

## 2020-10-08 DIAGNOSIS — G249 Dystonia, unspecified: Secondary | ICD-10-CM

## 2020-10-08 DIAGNOSIS — R29898 Other symptoms and signs involving the musculoskeletal system: Secondary | ICD-10-CM

## 2020-10-08 NOTE — Progress Notes (Signed)
NEUROLOGY FOLLOW UP OFFICE NOTE  Joseph Richards [id number] [birth year]  HISTORY OF PRESENT ILLNESS: I had the pleasure of seeing Joseph Richards in follow-up in the neurology clinic on 10/08/2020.  The patient was last seen almost 2 years ago for gait difficulties and right foot dystonia. He presents today for worsening gait after left foot surgery. He is again accompanied by his wife who helps supplement the history today.  Records and images were personally reviewed where available.  He had previously been evaluated by 2 Movement Disorders specialists with a diagnosis of dystonia and ataxia. He previously had Botox with Dr. Wynn Banker, last dose was in January 2021 with note of improvement with Botox with still some toe drag that improves when his shoe is off, Botox dose increased. They state today that they feel the Botox did not help. He was given a right AFO which he has not used much. He was tried on Sinemet in the past with no clear benefit.  They report that he underwent left great toe bunionectomy with double osteotomy in July 2021. He feels the swelling in his big toe has not gone down to where it is comfortable. He states 3 screws were placed to straighten his toe but it shortened it, with his big toe overlapping the 2nd digit. Since then, walking has become very taxing and labor-intensive for him. He exercises his big toe daily with no change. He now cannot ambulate without his cane, at home he is lurching from wall to wall. He feels like his feet and legs are moving too fast and the rest of his body is trying to catch up. He has fallen a couple of times. He feels like the lower part of his legs from below the knees are carrying a heavy weight. There is no numbness or tingling except for toes in the left foot at times. It feels like there is a knot in the sole of his left foot when he walks without shoes/socks. His knees are bent more when ambulating. He denies any difficulties with his upper  extremities, no tremors. No neck/back pain, dysarthria/dysphagia. He had recent bloodwork with Dr. Clelia Croft and is awaiting word from physical therapy.   History on Initial Assessment 08/04/2016: This is a pleasant 75 yo LH man with a history of hypertension, hyperlipidemia, who presented with gait difficulties. He and his wife report that he has always had "somewhat of a limp" that becomes more pronounced throughout the day, but over the past 10-12 months, he started having problems where he would feel stiffness in his legs after he sits for a period of time then gets up to walk. He does not necessarily feel it his his joints, he indicates the stiffness and tightness appears to be in his upper thighs extending down the knee when he gets up. He would be awkward when he first gets up, but gets better as the day goes on. He denies any numbness or tingling, no back pain or neck pain. His wife reports that he does have back pain and feels he will need a walker within the year. They deny any falls. No bowel/bladder dysfunction. They have been married more than 38 years and she has always noticed he drags the right foot a little more. He states right foot difficulties were from injury from a pitchfork when he was a child. He has always had difficulties going down stairs. He has a poor sense of balance but is gotten worse. Recently she also noticed  that he would have slurred speech that comes and goes a couple of times a day, worse later in the day. He notices he tends to drool on the right side of his mouth sometimes. There is no family history of similar symptoms. He has occasional sinus headaches, no dizziness, diplopia, dysarthria/dysphagia. There was concern for some right sided facial asymmetry, not noticeable in the office today.   Update: He had a third opinion from Movement Disorders specialist at [facility] Dr. Arlana Pouch, with a diagnosis of dystonia and ataxia. Records were reviewed, additional bloodwork came back  normal for Anti-GAD Ab, ceruloplasmin, copper, vitamin E, MuSK Ab, acetylcholine Ab, RPR. He had genetic testing with Dr. Arbutus Leas with negative SCA panel and DYT1. They had discussed Botox and he was not ready. It was discussed that unless underlying cause for symptoms is found, if this is neurodegenerative in origin, treatment would be symptomatic. Of note, he has had an extensive evaluation with two EMG/NCV studies (2018 and 2019) which were normal. MRI brain in 2018 unremarkable, MRI lumbar spine mild degenerative changes, MRI cervical and thoracic spine showed degenerative changes, no myelopathy. He did a trial of Sinemet, currently on 25/100mg  2 tabs TID, he has not noticed any improvement in symptoms, he states he is about where he was in August but his wife reports his gait is definitely worse. He is shuffling more than walking, worse with his right foot. It seems to get better over the course of the day, but significantly worse since August, per wife. They deny any tremors or bradykinesia.  Diagnostic Data: I personally reviewed MRI brain with and without contrast done 06/2016 which did not show any acute changes. 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 MRI lumbar spine without contrast which did not show any acute changes, there was no evidence of nerve impingement or lower cord myelopathy, there were mild degenerative changes. EMG/NCV of both lower extremities was normal, no evidence of neuropathy or myelopathy. Bloodwork showed normal CK, RF, and B12.    PAST MEDICAL HISTORY: Past Medical History:  Diagnosis Date  . Dystonia   . GERD (gastroesophageal reflux disease)   . Hyperlipidemia   . Hypertension     MEDICATIONS: Current Outpatient Medications on File Prior to Visit  Medication Sig Dispense Refill  . aspirin EC 81 MG tablet Take 81 mg by mouth daily.    Marland Kitchen ibuprofen (ADVIL) 800 MG  tablet Take 1 tablet (800 mg total) by mouth 3 (three) times daily. 90 tablet 1  . MULTIPLE VITAMIN PO Take by mouth.    . Omega-3 Fatty Acids (FISH OIL) 1000 MG CAPS Take by mouth.    Marland Kitchen omeprazole (PRILOSEC) 40 MG capsule     . pravastatin (PRAVACHOL) 20 MG tablet     . oxyCODONE-acetaminophen (PERCOCET) 5-325 MG tablet Take 1 tablet by mouth every 6 (six) hours as needed for severe pain. (Patient not taking: Reported on 10/08/2020) 30 tablet 0   No current facility-administered medications on file prior to visit.    ALLERGIES: No Known Allergies  FAMILY HISTORY: Family History  Problem Relation Age of Onset  . Aneurysm Mother        brain  . Lung cancer Father   . Heart attack Father   . Breast cancer Sister   . Healthy Daughter     SOCIAL HISTORY: Social History   Socioeconomic History  . Marital status: Married  Spouse name: Not on file  . Number of children: Not on file  . Years of education: Not on file  . Highest education level: Not on file  Occupational History  . 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 Smoking status: Former Smoker    Quit date: 09/29/1982    Years since quitting: 38.0  . Smokeless tobacco: Current User    Types: Chew  Vaping Use  . Vaping Use: Never used  Substance and Sexual Activity  . Alcohol use: Yes    Comment: 1 drink every couple months  . Drug use: No  . Sexual activity: Yes    Partners: Female  Other Topics Concern  . Not on file  Social History Narrative   Left handed      Lives with wife   Social Determinants of Health   Financial Resource Strain: Not on file  Food Insecurity: Not on file  Transportation Needs: Not on file  Physical Activity: Not on file  Stress: Not on file  Social Connections: Not on file  Intimate Partner Violence: Not on file     PHYSICAL EXAM: Vitals:   10/08/20 0943  BP: 130/85  Pulse: 74  Resp: 20  SpO2: 97%   General: No acute distress Head:   Normocephalic/atraumatic Skin/Extremities: No rash, no edema Neurological Exam: alert and awake. No aphasia or dysarthria. Fund of knowledge is appropriate.  Recent and remote memory are intact.  Attention and concentration are normal.   Cranial nerves: Pupils equal, round. Extraocular movements intact with no nystagmus. Visual fields full.  No facial asymmetry.  Motor: Bulk and tone normal, no cogwheeling, muscle strength 5/5 throughout except for 3/5 left big toe extension. His right foot is slightly turned in but there is 5/5 eversion and inversion. Sensation intact to all modalities on both UE, intact cold, pin on both LE, decreased vibration sense to ankles bilaterally. Finger to nose testing intact.  Gait: he has difficulty rising from chair without use his hands. Gait is slow, cautious, unsteady with cane. Both knees are bent. Negative postural instability. No tremor. Good finger and foot taps.   IMPRESSION: This is a pleasant 75 yo LH man with a history of hypertension, hyperlipidemia, who presented in 2018 with stiffness and gait difficulties. He has had chronic right foot problems that have caused him to limp, however presented with right foot dragging at that time. He has had an extensive evaluation with MRI brain, cervical/thoracic/lumbar spine, EMG/NCV x 2 which have been unremarkable. He has seen 2 Movement Disorder specialists with a diagnosis of dystonia, genetic testing and dystonia labs negative. He did not feel Botox was helpful. After bunionectomy and left foot double osteotomy in July 2021, his gait has progressively worsened that he requires a cane. There is again no evidence of parkinson's disease today. Repeat EMG/NCV of both legs will be done to further evaluate changes. Proceed with physical therapy, would recommend using a walker at this point. We may consider a re-trial of Sinemet to see if there is any symptomatic improvement. Follow-up in 3-4 months, call for any changes.     Thank you for allowing me to participate in his care.  Please do not hesitate to call for any questions or concerns.    Patrcia Dolly, M.D.   CC: Dr. Clelia Croft

## 2020-10-08 NOTE — Patient Instructions (Addendum)
1. Schedule EMG/NCV of both legs  2. Bloodwork from Dr. Clelia Croft will be requested for review  3. Proceed with physical therapy  4. Follow-up with Dr. Logan Bores  5. Follow-up in 3 months, call for any changes

## 2020-10-21 ENCOUNTER — Ambulatory Visit (INDEPENDENT_AMBULATORY_CARE_PROVIDER_SITE_OTHER): Payer: Medicare PPO | Admitting: Podiatry

## 2020-10-21 ENCOUNTER — Ambulatory Visit (INDEPENDENT_AMBULATORY_CARE_PROVIDER_SITE_OTHER): Payer: Medicare PPO

## 2020-10-21 ENCOUNTER — Other Ambulatory Visit: Payer: Self-pay

## 2020-10-21 DIAGNOSIS — Z9889 Other specified postprocedural states: Secondary | ICD-10-CM

## 2020-10-21 DIAGNOSIS — M21272 Flexion deformity, left ankle and toes: Secondary | ICD-10-CM

## 2020-10-21 DIAGNOSIS — M2012 Hallux valgus (acquired), left foot: Secondary | ICD-10-CM

## 2020-10-21 NOTE — Progress Notes (Addendum)
   Subjective:  Patient presents today status post bunionectomy with double osteotomy left foot. DOS: 11/09/2019.  Patient states that he has had some problems ambulating to the left foot after his surgery.  Due to the shortening and elevation of the left hallux postoperatively, he has had gait and balance issues.  He also feels that possibly the screw is moving inside of the foot.  He presents for further treatment and evaluation  Past Medical History:  Diagnosis Date   Dystonia    GERD (gastroesophageal reflux disease)    Hyperlipidemia    Hypertension       Objective/Physical Exam Neurovascular status intact.  Shortening of the first ray noted.  There is dorsiflexion and abduction of the hallux at the level of the MTPJ of the toe.  Limited range of motion noted to the first MTPJ postsurgical especially with plantar flexion negative for any significant pain on palpation or range of motion.  Radiographic exam Healing of the osteotomy sites of the first ray noted however there is shortening of the first metatarsal.  Orthopedic screws are intact.  No fractures identified.  Elevatus of the hallux noted on lateral view  Assessment: 1. s/p bunionectomy with double osteotomy left. DOS: 11/09/2019 2.  Shortening of the first ray with dorsiflexion and abduction of the hallux at the MTPJ 3.  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 Patient was evaluated.  X-rays reviewed.  At this time I do feel that arthrodesis of the great toe joint/first MTPJ would be the most beneficial for the patient to create a more rectus alignment and stability of the foot 2. Today we discussed the conservative versus surgical management of the presenting pathology. The patient opts for surgical management. All possible complications and details of the procedure were explained. All patient questions were answered. No guarantees were expressed or implied. 3. Authorization for surgery was initiated today.  Surgery will consist of removal of orthopedic screws x3 left foot.  First MTPJ arthrodesis with bone allograft/or autograft left ([employer] first MTPJ system) 4.  Order placed for narrow wheelchair postoperatively.  Patient will be strict nonweightbearing postoperatively x6-8 weeks.  Medically necessary.   5.  Return to clinic 1 week postop  *Wants to be able to walk his daughter down the aisle; March 01, 2021  Felecia Shelling, DPM [facility]  Dr. Felecia Shelling, DPM    [address] [place], [place] [address]                Office [phone]  Fax [phone]

## 2020-10-21 NOTE — Progress Notes (Signed)
G f 

## 2020-10-28 ENCOUNTER — Telehealth: Payer: Self-pay

## 2020-10-28 NOTE — Telephone Encounter (Signed)
DOS 11/14/2020  REMOVAL DEEP FIXATION LT - 20680 HALLUX MPJ FUSION LT - 28750  [facility]  NO PRECERT REQUIRED FOR CPT 20680 PER [facility] WEBSITE.  Procedure Foot Surgeries: Bunionectomy and Hammertoe Code Description [phone] Arthrodesis, great toe; metatarsophalangeal joint Diagnosis Z48.89 Encounter for other speci(ed surgical aftercare )primaryM 12x.272 Flekion deformity, left anGle and toes Attention breens/[facility] date 11-14-2020 6 x0-09-2020 fuNber oM service dates x Isaias Sakai [id number] Patient naNe Rayane, Gallardo Patient Barnetta [place] )[place] [id number] Patient date oM birth [id number] yOTe Putpatient Frdering Pollyann Glen 1onroe DT1 PerMorNing Trovider Gala Lewandowsky 1onroe DT1 laciHitO breens/[facility] authorization nuNber [id number]

## 2020-10-29 ENCOUNTER — Telehealth: Payer: Self-pay | Admitting: Neurology

## 2020-10-29 NOTE — Telephone Encounter (Signed)
Would do the test on 6/29 anyway, we are looking for other causes of symptoms aside from his foot. Thanks

## 2020-10-29 NOTE — Telephone Encounter (Signed)
Advised to patient. 

## 2020-10-29 NOTE — Telephone Encounter (Signed)
Pt called in and was wondering if the doctor wants him to keep the 6/29 testing or resch it after his surgery on 7/7. [phone] best # to reach him

## 2020-11-06 ENCOUNTER — Other Ambulatory Visit: Payer: Self-pay | Admitting: Podiatry

## 2020-11-06 ENCOUNTER — Ambulatory Visit: Payer: Medicare PPO | Admitting: Neurology

## 2020-11-06 ENCOUNTER — Telehealth: Payer: Self-pay

## 2020-11-06 ENCOUNTER — Other Ambulatory Visit: Payer: Self-pay

## 2020-11-06 DIAGNOSIS — R29898 Other symptoms and signs involving the musculoskeletal system: Secondary | ICD-10-CM

## 2020-11-06 DIAGNOSIS — R2681 Unsteadiness on feet: Secondary | ICD-10-CM

## 2020-11-06 DIAGNOSIS — M21272 Flexion deformity, left ankle and toes: Secondary | ICD-10-CM

## 2020-11-06 DIAGNOSIS — M2012 Hallux valgus (acquired), left foot: Secondary | ICD-10-CM

## 2020-11-06 NOTE — Procedures (Signed)
[facility]  [address], [address]  [place], [place] [address] Tel: [phone] Fax:  [phone] Test Date:  11/06/2020  Patient: Joseph Richards DOB: Oct 13, 1945 Physician: Nita Sickle, DO  Sex: Male Height: 5\' 6"  Ref Phys: , M.D.  ID#: Patrcia Dolly   Technician:    Patient Complaints: This is a 75 year old man referred for evaluation of gait instability.  NCV & EMG Findings: Electrodiagnostic testing of the right lower extremity and additional studies of the left shows: Bilateral sural and superficial peroneal sensory responses are within normal limits. Bilateral peroneal and tibial motor responses are within normal limits. Bilateral tibial H reflex mildly prolonged and of uncertain clinical significance. There is no evidence of active or chronic motor axonal changes affecting any of the tested muscles.  There is incomplete motor unit activation as seen by variable recruitment pattern in the muscles below the knee.  These findings may be due to pain, poor effort, or central disorder of motor unit control.   Impression: This is a normal study of the lower extremities.  In particular, there is no evidence of a sensorimotor polyneuropathy or lumbosacral radiculopathy.   ___________________________ 61, DO    Nerve Conduction Studies Anti Sensory Summary Table   Stim Site NR Peak (ms) Norm Peak (ms) P-T Amp (V) Norm P-T Amp  Left Sup Peroneal Anti Sensory (Ant Lat Mall)  32C  12 cm    2.2 <4.6 9.5 >3  Right Sup Peroneal Anti Sensory (Ant Lat Mall)  32C  12 cm    2.5 <4.6 9.6 >3  Left Sural Anti Sensory (Lat Mall)  32C  Calf    2.7 <4.6 7.8 >3  Right Sural Anti Sensory (Lat Mall)  32C  Calf    2.6 <4.6 8.1 >3   Motor Summary Table   Stim Site NR Onset (ms) Norm Onset (ms) O-P Amp (mV) Norm O-P Amp Site1 Site2 Delta-0 (ms) Dist (cm) Vel (m/s) Norm Vel (m/s)  Left Peroneal Motor (Ext Dig Brev)  32C  Ankle    4.6 <6.0 2.5 >2.5 B Fib  Ankle 8.3 37.0 45 >40  B Fib    12.9  1.7  Poplt B Fib 1.2 7.0 58 >40  Poplt    14.1  1.6         Right Peroneal Motor (Ext Dig Brev)  32C  Ankle    2.6 <6.0 5.3 >2.5 B Fib Ankle 7.9 38.0 48 >40  B Fib    10.5  4.3  Poplt B Fib 1.5 8.0 53 >40  Poplt    12.0  4.4         Left Tibial Motor (Abd Hall Brev)  32C  Ankle    4.3 <6.0 3.8 >4 Knee Ankle 7.4 39.0 53 >40  Knee    11.7  2.5         Right Tibial Motor (Abd Hall Brev)  32C  Ankle    3.7 <6.0 7.4 >4 Knee Ankle 8.2 42.0 51 >40  Knee    11.9  7.0          H Reflex Studies   NR H-Lat (ms) Lat Norm (ms) L-R H-Lat (ms)  Left Tibial (Gastroc)  32C     36.19 <35 0.00  Right Tibial (Gastroc)  32C     36.19 <35 0.00   EMG   Side Muscle Ins Act Fibs Psw Fasc Number Recrt Dur Dur. Amp Amp. Poly Poly. Comment  Right AntTibialis Nml Nml  Nml Nml Nml Nml Nml Nml Nml Nml Nml Nml N/A  Right Gastroc Nml Nml Nml Nml Nml Nml Nml Nml Nml Nml Nml Nml N/A  Right Flex Dig Long Nml Nml Nml Nml 1- Mod-V Nml Nml Nml Nml Nml Nml N/A  Right RectFemoris Nml Nml Nml Nml Nml Nml Nml Nml Nml Nml Nml Nml N/A  Right GluteusMed Nml Nml Nml Nml Nml Nml Nml Nml Nml Nml Nml Nml N/A  Left AntTibialis Nml Nml Nml Nml 1- Mod-V Nml Nml Nml Nml Nml Nml N/A  Left Gastroc Nml Nml Nml Nml 2- Mod-V Nml Nml Nml Nml Nml Nml N/A  Left Flex Dig Long Nml Nml Nml Nml 2- Mod-V Nml Nml Nml Nml Nml Nml N/A  Left RectFemoris Nml Nml Nml Nml Nml Nml Nml Nml Nml Nml Nml Nml N/A  Left GluteusMed Nml Nml Nml Nml Nml Nml Nml Nml Nml Nml Nml Nml N/A      Waveforms:

## 2020-11-06 NOTE — Telephone Encounter (Signed)
Graceson called requesting a wheelchair for his upcoming surgery on 11/14/2020. He is requesting a narrow wheelchair. Please advise.

## 2020-11-06 NOTE — Progress Notes (Signed)
PRN postop 

## 2020-11-06 NOTE — Telephone Encounter (Signed)
I just placed an order in the patient's chart for a wheelchair, narrow.  I also addended his last visit Thanks, Dr. Logan Bores

## 2020-11-13 DIAGNOSIS — M2012 Hallux valgus (acquired), left foot: Secondary | ICD-10-CM | POA: Diagnosis not present

## 2020-11-13 DIAGNOSIS — M21272 Flexion deformity, left ankle and toes: Secondary | ICD-10-CM | POA: Diagnosis not present

## 2020-11-14 ENCOUNTER — Encounter: Payer: Self-pay | Admitting: Podiatry

## 2020-11-14 ENCOUNTER — Other Ambulatory Visit: Payer: Self-pay | Admitting: Podiatry

## 2020-11-14 DIAGNOSIS — M2022 Hallux rigidus, left foot: Secondary | ICD-10-CM | POA: Diagnosis not present

## 2020-11-14 DIAGNOSIS — M21272 Flexion deformity, left ankle and toes: Secondary | ICD-10-CM | POA: Diagnosis not present

## 2020-11-14 DIAGNOSIS — Z4889 Encounter for other specified surgical aftercare: Secondary | ICD-10-CM | POA: Diagnosis not present

## 2020-11-14 HISTORY — PX: FOOT SURGERY: SHX648

## 2020-11-14 MED ORDER — IBUPROFEN 800 MG PO TABS
800.0000 mg | ORAL_TABLET | Freq: Three times a day (TID) | ORAL | 1 refills | Status: DC
Start: 2020-11-14 — End: 2021-01-14

## 2020-11-14 MED ORDER — OXYCODONE-ACETAMINOPHEN 5-325 MG PO TABS: 1.0000 | ORAL_TABLET | ORAL | 0 refills | Status: DC | PRN

## 2020-11-14 NOTE — Progress Notes (Signed)
PRN postop 

## 2020-11-20 ENCOUNTER — Ambulatory Visit (INDEPENDENT_AMBULATORY_CARE_PROVIDER_SITE_OTHER): Payer: Medicare PPO | Admitting: Podiatry

## 2020-11-20 ENCOUNTER — Other Ambulatory Visit: Payer: Self-pay

## 2020-11-20 ENCOUNTER — Ambulatory Visit (INDEPENDENT_AMBULATORY_CARE_PROVIDER_SITE_OTHER): Payer: Medicare PPO

## 2020-11-20 DIAGNOSIS — Z9889 Other specified postprocedural states: Secondary | ICD-10-CM | POA: Diagnosis not present

## 2020-11-20 NOTE — Progress Notes (Signed)
   Subjective:  Patient presents today status post first MTPJ arthrodesis with bone allograft left. DOS: 11/14/2020.  Patient states that he is doing well.  He has been nonweightbearing in the cam boot as instructed.  He presents today with his wife.  No new complaints at this time  Past Medical History:  Diagnosis Date   Dystonia    GERD (gastroesophageal reflux disease)    Hyperlipidemia    Hypertension       Objective/Physical Exam Neurovascular status intact.  Skin incisions appear to be well coapted with staples intact. No sign of infectious process noted. No dehiscence. No active bleeding noted. Moderate edema noted to the surgical extremity. There there is some elevatus noted to the first ray.  I did explain to the patient that it was very difficult to get the first ray plantar flexed as much as possible intraoperatively however there was some residual elevatus even after fixation despite multiple attempts to plantarflex the first ray during fixation.   Radiographic Exam:  Orthopedic hardware and osteotomies sites appear to be stable with routine healing and there is intact bone allograft noted with good alignment..  Assessment: 1. s/p first MTPJ arthrodesis with allograft. DOS: 11/14/2020   Plan of Care:  1. Patient was evaluated. X-rays reviewed 2.  I do believe that once this heals the patient will be significantly better however he may continue to have some mild valgus 3.  Continue strict nonweightbearing in the cam boot 4.  Return to clinic in 1 week for staple removal   Felecia Shelling, DPM [facility]  Dr. Felecia Shelling, DPM    [address] [place], [place] [address]                Office [phone]  Fax [phone]

## 2020-11-27 ENCOUNTER — Other Ambulatory Visit: Payer: Self-pay

## 2020-11-27 ENCOUNTER — Ambulatory Visit (INDEPENDENT_AMBULATORY_CARE_PROVIDER_SITE_OTHER): Payer: Medicare PPO | Admitting: Podiatry

## 2020-11-27 DIAGNOSIS — Z9889 Other specified postprocedural states: Secondary | ICD-10-CM

## 2020-11-27 NOTE — Progress Notes (Signed)
   Subjective:  Patient presents today status post first MTPJ arthrodesis with bone allograft left. DOS: 11/14/2020.  Patient states that he is doing well.  He has been nonweightbearing in the cam boot as instructed.  He presents today with his wife.  No new complaints at this time  Past Medical History:  Diagnosis Date   Dystonia    GERD (gastroesophageal reflux disease)    Hyperlipidemia    Hypertension       Objective/Physical Exam Neurovascular status intact.  Skin incisions appear to be well coapted with staples intact. No sign of infectious process noted. No dehiscence. No active bleeding noted. Moderate edema noted to the surgical extremity. There there is some elevatus noted to the first ray.  I did explain to the patient that it was very difficult to get the first ray plantar flexed as much as possible intraoperatively however there was some residual elevatus even after fixation despite multiple attempts to plantarflex the first ray during fixation.    Assessment: 1. s/p first MTPJ arthrodesis with allograft. DOS: 11/14/2020   Plan of Care:  1. Patient was evaluated.  2.  Staples removed today 3.  Patient may begin partial weightbearing in the cam boot with the assistance of a walker. 4.  Return to clinic in 4 weeks   Felecia Shelling, DPM [facility]  Dr. Felecia Shelling, DPM    [address] [place], [place] [address]                Office [phone]  Fax [phone]

## 2020-12-05 ENCOUNTER — Telehealth: Payer: Self-pay | Admitting: Podiatry

## 2020-12-05 NOTE — Telephone Encounter (Signed)
Patient's wife called the office stating he fell but the pin is still in his toe and it is bleeding. She wants to know if they should come in the office before their Appointment on 12/16/20

## 2020-12-06 NOTE — Telephone Encounter (Signed)
Please schedule for a sooner appointment asap..pin maybe coming out.

## 2020-12-06 NOTE — Telephone Encounter (Signed)
Spoke with patient and told her to wrap gauze around the pin and toe to keep stable, will have someone to schedule for a sooner appointment.

## 2020-12-10 NOTE — Telephone Encounter (Signed)
Sounds good. - Dr. Logan Bores

## 2020-12-16 ENCOUNTER — Other Ambulatory Visit: Payer: Self-pay

## 2020-12-16 ENCOUNTER — Ambulatory Visit (INDEPENDENT_AMBULATORY_CARE_PROVIDER_SITE_OTHER): Payer: Medicare PPO | Admitting: Podiatry

## 2020-12-16 ENCOUNTER — Ambulatory Visit (INDEPENDENT_AMBULATORY_CARE_PROVIDER_SITE_OTHER): Payer: Medicare PPO

## 2020-12-16 ENCOUNTER — Encounter: Payer: Medicare PPO | Admitting: Podiatry

## 2020-12-16 DIAGNOSIS — Z9889 Other specified postprocedural states: Secondary | ICD-10-CM

## 2020-12-16 MED ORDER — DOXYCYCLINE HYCLATE 100 MG PO TABS
100.0000 mg | ORAL_TABLET | Freq: Two times a day (BID) | ORAL | 0 refills | Status: DC
Start: 2020-12-16 — End: 2021-01-07

## 2020-12-16 NOTE — Progress Notes (Signed)
   Subjective:  Patient presents today status post first MTPJ arthrodesis with bone allograft left. DOS: 11/14/2020.  Patient states that he is doing well.  He is slightly concerned because he did sustain a trip and fall injury on 12/05/2020 and notices increased redness and swelling.  He presents for follow-up treatment and evaluation  Past Medical History:  Diagnosis Date   Dystonia    GERD (gastroesophageal reflux disease)    Hyperlipidemia    Hypertension       Objective/Physical Exam Neurovascular status intact.  Skin incisions appear to be well coapted with staples intact.  Mild erythema noted localized around the toe which may be simply due to the patient's trip and fall injury that he sustained.  No dehiscence. No active bleeding noted. Moderate edema noted to the surgical extremity. There there is some elevatus noted to the first ray.  I did explain to the patient that it was very difficult to get the first ray plantar flexed as much as possible intraoperatively however there was some residual elevatus even after fixation despite multiple attempts to plantarflex the first ray during fixation.    Assessment: 1. s/p first MTPJ arthrodesis with allograft. DOS: 11/14/2020   Plan of Care:  1. Patient was evaluated.  Percutaneous fixation pin removed today 2. Continue minimal weightbearing in the cam boot 3.  Prescription for doxycycline 100 mg 2 times daily #20.  I do believe the redness and swelling is due to the trip and fall injury versus true infection however to be safe we will prescribe the antibiotic 4.  Return to clinic in 4 weeks for follow-up x-ray   Felecia Shelling, DPM [facility]  Dr. Felecia Shelling, DPM    [address] [place], [place] [address]                Office [phone]  Fax [phone]

## 2021-01-07 ENCOUNTER — Other Ambulatory Visit: Payer: Self-pay

## 2021-01-07 ENCOUNTER — Ambulatory Visit: Payer: Medicare PPO | Admitting: Neurology

## 2021-01-07 ENCOUNTER — Encounter: Payer: Self-pay | Admitting: Neurology

## 2021-01-07 VITALS — BP 137/82 | HR 83 | Ht 68.0 in | Wt 196.0 lb

## 2021-01-07 DIAGNOSIS — R2681 Unsteadiness on feet: Secondary | ICD-10-CM | POA: Diagnosis not present

## 2021-01-07 DIAGNOSIS — G249 Dystonia, unspecified: Secondary | ICD-10-CM | POA: Diagnosis not present

## 2021-01-07 NOTE — Progress Notes (Signed)
NEUROLOGY FOLLOW UP OFFICE NOTE  Joseph Richards [id number] 17-May-1945  HISTORY OF PRESENT ILLNESS: I had the pleasure of seeing Joseph Richards in follow-up in the neurology clinic on 01/07/2021.  The patient was last seen 5 months ago for worsening gait since left foot surgery. He is again accompanied by his wife who helps supplement the history today.  Records and images were personally reviewed where available.  EMG/NCV of both legs done 10/2020 was normal. There was note of incomplete motor unit activation with variable recruitment pattern in the muscles below the knee, which can be seen with dystonia (known diagnosis from 2 prior Movement Disorder specialists). On his last visit, he presented due to worsening gait after left foot surgery. Since then, he underwent first MTPJ arthrodesis with bone allograft last 11/14/20, foot is still in a boot but they note that he is walking a little bit better. He is still dependent on the cane but he can tell a difference. He had a fall soon after the surgery, but none in the past month. He denies any headaches, dizziness, vision changes.    History on Initial Assessment 08/04/2016: This is a pleasant 75 yo LH man with a history of hypertension, hyperlipidemia, who presented with gait difficulties. He and his wife report that he has always had "somewhat of a limp" that becomes more pronounced throughout the day, but over the past 10-12 months, he started having problems where he would feel stiffness in his legs after he sits for a period of time then gets up to walk. He does not necessarily feel it his his joints, he indicates the stiffness and tightness appears to be in his upper thighs extending down the knee when he gets up. He would be awkward when he first gets up, but gets better as the day goes on. He denies any numbness or tingling, no back pain or neck pain. His wife reports that he does have back pain and feels he will need a walker within the year. They  deny any falls. No bowel/bladder dysfunction. They have been married more than 38 years and she has always noticed he drags the right foot a little more. He states right foot difficulties were from injury from a pitchfork when he was a child. He has always had difficulties going down stairs. He has a poor sense of balance but is gotten worse. Recently she also noticed that he would have slurred speech that comes and goes a couple of times a day, worse later in the day. He notices he tends to drool on the right side of his mouth sometimes. There is no family history of similar symptoms. He has occasional sinus headaches, no dizziness, diplopia, dysarthria/dysphagia. There was concern for some right sided facial asymmetry, not noticeable in the office today.   Update: He had a third opinion from Movement Disorders specialist at [facility] Dr. Arlana Pouch, with a diagnosis of dystonia and ataxia. Records were reviewed, additional bloodwork came back normal for Anti-GAD Ab, ceruloplasmin, copper, vitamin E, MuSK Ab, acetylcholine Ab, RPR. He had genetic testing with Dr. Arbutus Leas with negative SCA panel and DYT1. They had discussed Botox and he was not ready. It was discussed that unless underlying cause for symptoms is found, if this is neurodegenerative in origin, treatment would be symptomatic. Of note, he has had an extensive evaluation with two EMG/NCV studies (2018 and 2019) which were normal. MRI brain in 2018 unremarkable, MRI lumbar spine mild degenerative changes, MRI cervical and  thoracic spine showed degenerative changes, no myelopathy. He did a trial of Sinemet, currently on 25/100mg  2 tabs TID, he has not noticed any improvement in symptoms, he states he is about where he was in August but his wife reports his gait is definitely worse. He is shuffling more than walking, worse with his right foot. It seems to get better over the course of the day, but significantly worse since August, per wife. They deny any tremors  or bradykinesia.  Diagnostic Data: I personally reviewed MRI brain with and without contrast done 06/2016 which did not show any acute changes. 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 MRI lumbar spine without contrast which did not show any acute changes, there was no evidence of nerve impingement or lower cord myelopathy, there were mild degenerative changes. EMG/NCV of both lower extremities was normal, no evidence of neuropathy or myelopathy. Bloodwork showed normal CK, RF, and B12.   PAST MEDICAL HISTORY: Past Medical History:  Diagnosis Date   Dystonia    GERD (gastroesophageal reflux disease)    Hyperlipidemia    Hypertension     MEDICATIONS: Current Outpatient Medications on File Prior to Visit  Medication Sig Dispense Refill   aspirin EC 81 MG tablet Take 81 mg by mouth daily.     ibuprofen (ADVIL) 800 MG tablet Take 1 tablet (800 mg total) by mouth 3 (three) times daily. 90 tablet 1   MULTIPLE VITAMIN PO Take by mouth.     Omega-3 Fatty Acids (FISH OIL) 1000 MG CAPS Take by mouth.     omeprazole (PRILOSEC) 40 MG capsule      pravastatin (PRAVACHOL) 20 MG tablet      No current facility-administered medications on file prior to visit.    ALLERGIES: No Known Allergies  FAMILY HISTORY: Family History  Problem Relation Age of Onset   Aneurysm Mother        brain   Lung cancer Father    Heart attack Father    Breast cancer Sister    Healthy Daughter     SOCIAL HISTORY: Social History   Socioeconomic History   Marital status: Married    Spouse name: Not on file   Number of children: Not on file   Years of education: Not on file   Highest education level: Not on file  Occupational History   Occupation: retired    Comment: office supply sales  Tobacco Use   Smoking status: Former   Smokeless tobacco: Current    Types: Associate Professor Use:  Never used  Substance and Sexual Activity   Alcohol use: Yes    Comment: 1 drink every couple months   Drug use: No   Sexual activity: Yes    Partners: Female  Other Topics Concern   Not on file  Social History Narrative   Left handed      Lives with wife   Social Determinants of Health   Financial Resource Strain: Not on file  Food Insecurity: Not on file  Transportation Needs: Not on file  Physical Activity: Not on file  Stress: Not on file  Social Connections: Not on file  Intimate Partner Violence: Not on file     PHYSICAL EXAM: Vitals:   01/07/21 1341  BP: 137/82  Pulse: 83  SpO2: 98%   General: No acute distress Head:  Normocephalic/atraumatic Skin/Extremities: No rash, no edema, left foot in boot  Neurological Exam: alert and awake. No aphasia or dysarthria. Fund of knowledge is appropriate.  Attention and concentration are normal.   Cranial nerves: Pupils equal, round. Extraocular movements intact with no nystagmus. Visual fields full.  No facial asymmetry.  Motor: Bulk and tone normal, no cogwheeling, muscle strength 5/5 throughout with no pronator drift.   Finger to nose testing intact.  Gait favors left foot in boot, no ataxia. No tremors. Good finger and right foot taps.    IMPRESSION: This is a pleasant 75 yo LH man with a history of hypertension, hyperlipidemia, who presented in 2018 with stiffness and gait difficulties. He has had chronic right foot problems that have caused him to limp, however presented with right foot dragging at that time. He has had an extensive evaluation with MRI brain, cervical/thoracic/lumbar spine, EMG/NCV x 2 which have been unremarkable. He has seen 2 Movement Disorder specialists with a diagnosis of dystonia, genetic testing and dystonia labs negative. He did not feel Botox was helpful. He had worsening gait after left foot surgery done July 2021, repeat EMG/NCV did not show any evidence of neuropathy or radiculopathy as cause of  worsening gait. He notes some improvement since repeat surgery done last July 2022. Suspect worsening gait was mechanical on top of underlying dystonia. If there is no improvement as his foot heals from surgery, they know to call our office and we can consider another trial of Sinemet. Proceed with physical therapy. Follow-up as needed, call for any changes.    Thank you for allowing me to participate in his care.  Please do not hesitate to call for any questions or concerns.    Joseph Richards, M.D.   CC: Dr. Clelia Croft

## 2021-01-07 NOTE — Patient Instructions (Addendum)
Always good to see you! Wishing you well with upcoming appointment with Dr. Logan Bores. Follow-up as needed, call for any changes

## 2021-01-11 ENCOUNTER — Other Ambulatory Visit: Payer: Self-pay | Admitting: Podiatry

## 2021-01-13 NOTE — Telephone Encounter (Signed)
Please advise 

## 2021-01-15 ENCOUNTER — Ambulatory Visit (INDEPENDENT_AMBULATORY_CARE_PROVIDER_SITE_OTHER): Payer: Medicare PPO

## 2021-01-15 ENCOUNTER — Ambulatory Visit (INDEPENDENT_AMBULATORY_CARE_PROVIDER_SITE_OTHER): Payer: Medicare PPO | Admitting: Podiatry

## 2021-01-15 ENCOUNTER — Other Ambulatory Visit: Payer: Self-pay

## 2021-01-15 DIAGNOSIS — Z9889 Other specified postprocedural states: Secondary | ICD-10-CM

## 2021-01-15 NOTE — Progress Notes (Signed)
   Subjective:  Patient presents today status post first MTPJ arthrodesis with bone allograft left. DOS: 11/14/2020.  Patient states that he is doing well.  No new complaints at this time.  He has been mostly weightbearing in the cam boot  Past Medical History:  Diagnosis Date   Dystonia    GERD (gastroesophageal reflux disease)    Hyperlipidemia    Hypertension       Objective/Physical Exam Neurovascular status intact.  Skin incisions appear to be well coapted with staples intact.  Mild erythema noted localized around the toe which may be simply due to the patient's trip and fall injury that he sustained.  No dehiscence. No active bleeding noted. Moderate edema noted to the surgical extremity. There there is some very slight elevatus noted to the first ray.  I did explain to the patient that it was very difficult to get the first ray plantar flexed as much as possible intraoperatively however there was some residual elevatus even after fixation despite multiple attempts to plantarflex the first ray during fixation.   Radiographic exam Orthopedic hardware is intact.  Overall there is very good alignment of the first ray and the bone allograft is intact with good demonstration of healing   Assessment: 1. s/p first MTPJ arthrodesis with allograft. DOS: 11/14/2020   Plan of Care:  1. Patient was evaluated.  X-rays reviewed today.  Good alignment of the first ray 2.  Patient may begin weightbearing in good supportive sneakers around the house.  If the patient is going to be on his feet for a prolonged period of time recommend cam boot. 3.  Prescription provided today for physical therapy 2 times per week.  The patient would like to go to his own physical therapy location.  The goal of physical therapy would be for gait training and to assist in stability during walking 4.  Return to clinic in 4 weeks for follow-up x-ray  *Wants to be able to walk his daughter down the aisle; March 01, 2021  Felecia Shelling, DPM [facility]  Dr. Felecia Shelling, DPM    [address] [place], [place] [address]                Office [phone]  Fax [phone]

## 2021-01-27 DIAGNOSIS — M79672 Pain in left foot: Secondary | ICD-10-CM | POA: Diagnosis not present

## 2021-01-27 DIAGNOSIS — G249 Dystonia, unspecified: Secondary | ICD-10-CM | POA: Diagnosis not present

## 2021-01-27 DIAGNOSIS — R2689 Other abnormalities of gait and mobility: Secondary | ICD-10-CM | POA: Diagnosis not present

## 2021-01-29 DIAGNOSIS — R2689 Other abnormalities of gait and mobility: Secondary | ICD-10-CM | POA: Diagnosis not present

## 2021-01-29 DIAGNOSIS — G249 Dystonia, unspecified: Secondary | ICD-10-CM | POA: Diagnosis not present

## 2021-01-29 DIAGNOSIS — M79672 Pain in left foot: Secondary | ICD-10-CM | POA: Diagnosis not present

## 2021-01-30 DIAGNOSIS — R2689 Other abnormalities of gait and mobility: Secondary | ICD-10-CM | POA: Diagnosis not present

## 2021-01-30 DIAGNOSIS — G249 Dystonia, unspecified: Secondary | ICD-10-CM | POA: Diagnosis not present

## 2021-01-30 DIAGNOSIS — M79672 Pain in left foot: Secondary | ICD-10-CM | POA: Diagnosis not present

## 2021-02-03 DIAGNOSIS — M79672 Pain in left foot: Secondary | ICD-10-CM | POA: Diagnosis not present

## 2021-02-03 DIAGNOSIS — G249 Dystonia, unspecified: Secondary | ICD-10-CM | POA: Diagnosis not present

## 2021-02-03 DIAGNOSIS — R2689 Other abnormalities of gait and mobility: Secondary | ICD-10-CM | POA: Diagnosis not present

## 2021-02-04 DIAGNOSIS — G249 Dystonia, unspecified: Secondary | ICD-10-CM | POA: Diagnosis not present

## 2021-02-04 DIAGNOSIS — R2689 Other abnormalities of gait and mobility: Secondary | ICD-10-CM | POA: Diagnosis not present

## 2021-02-04 DIAGNOSIS — M79672 Pain in left foot: Secondary | ICD-10-CM | POA: Diagnosis not present

## 2021-02-06 DIAGNOSIS — G249 Dystonia, unspecified: Secondary | ICD-10-CM | POA: Diagnosis not present

## 2021-02-06 DIAGNOSIS — M79672 Pain in left foot: Secondary | ICD-10-CM | POA: Diagnosis not present

## 2021-02-06 DIAGNOSIS — R2689 Other abnormalities of gait and mobility: Secondary | ICD-10-CM | POA: Diagnosis not present

## 2021-02-10 DIAGNOSIS — M79672 Pain in left foot: Secondary | ICD-10-CM | POA: Diagnosis not present

## 2021-02-10 DIAGNOSIS — R2689 Other abnormalities of gait and mobility: Secondary | ICD-10-CM | POA: Diagnosis not present

## 2021-02-10 DIAGNOSIS — G249 Dystonia, unspecified: Secondary | ICD-10-CM | POA: Diagnosis not present

## 2021-02-12 DIAGNOSIS — G249 Dystonia, unspecified: Secondary | ICD-10-CM | POA: Diagnosis not present

## 2021-02-12 DIAGNOSIS — R2689 Other abnormalities of gait and mobility: Secondary | ICD-10-CM | POA: Diagnosis not present

## 2021-02-12 DIAGNOSIS — M79672 Pain in left foot: Secondary | ICD-10-CM | POA: Diagnosis not present

## 2021-02-14 DIAGNOSIS — M79672 Pain in left foot: Secondary | ICD-10-CM | POA: Diagnosis not present

## 2021-02-14 DIAGNOSIS — R2689 Other abnormalities of gait and mobility: Secondary | ICD-10-CM | POA: Diagnosis not present

## 2021-02-14 DIAGNOSIS — G249 Dystonia, unspecified: Secondary | ICD-10-CM | POA: Diagnosis not present

## 2021-02-17 ENCOUNTER — Other Ambulatory Visit: Payer: Self-pay

## 2021-02-17 ENCOUNTER — Ambulatory Visit (INDEPENDENT_AMBULATORY_CARE_PROVIDER_SITE_OTHER): Payer: Medicare PPO

## 2021-02-17 ENCOUNTER — Ambulatory Visit: Payer: Medicare PPO | Admitting: Podiatry

## 2021-02-17 DIAGNOSIS — R2689 Other abnormalities of gait and mobility: Secondary | ICD-10-CM | POA: Diagnosis not present

## 2021-02-17 DIAGNOSIS — Z9889 Other specified postprocedural states: Secondary | ICD-10-CM | POA: Diagnosis not present

## 2021-02-17 DIAGNOSIS — M79672 Pain in left foot: Secondary | ICD-10-CM | POA: Diagnosis not present

## 2021-02-17 DIAGNOSIS — G249 Dystonia, unspecified: Secondary | ICD-10-CM | POA: Diagnosis not present

## 2021-02-19 DIAGNOSIS — M79672 Pain in left foot: Secondary | ICD-10-CM | POA: Diagnosis not present

## 2021-02-19 DIAGNOSIS — G249 Dystonia, unspecified: Secondary | ICD-10-CM | POA: Diagnosis not present

## 2021-02-19 DIAGNOSIS — R2689 Other abnormalities of gait and mobility: Secondary | ICD-10-CM | POA: Diagnosis not present

## 2021-02-24 DIAGNOSIS — M79672 Pain in left foot: Secondary | ICD-10-CM | POA: Diagnosis not present

## 2021-02-24 DIAGNOSIS — G249 Dystonia, unspecified: Secondary | ICD-10-CM | POA: Diagnosis not present

## 2021-02-24 DIAGNOSIS — R2689 Other abnormalities of gait and mobility: Secondary | ICD-10-CM | POA: Diagnosis not present

## 2021-02-26 DIAGNOSIS — M79672 Pain in left foot: Secondary | ICD-10-CM | POA: Diagnosis not present

## 2021-02-26 DIAGNOSIS — R2689 Other abnormalities of gait and mobility: Secondary | ICD-10-CM | POA: Diagnosis not present

## 2021-02-26 DIAGNOSIS — G249 Dystonia, unspecified: Secondary | ICD-10-CM | POA: Diagnosis not present

## 2021-03-02 NOTE — Progress Notes (Signed)
   Subjective:  Patient presents today status post first MTPJ arthrodesis with bone allograft left. DOS: 11/14/2020.  Patient states that he is doing well.  No new complaints at this time.  He has been mostly weightbearing in the cam boot  Past Medical History:  Diagnosis Date   Dystonia    GERD (gastroesophageal reflux disease)    Hyperlipidemia    Hypertension       Objective/Physical Exam Neurovascular status intact.  Skin incisions appear to be well coapted with staples intact.  Mild erythema noted localized around the toe which may be simply due to the patient's trip and fall injury that he sustained.  No dehiscence. No active bleeding noted. Moderate edema noted to the surgical extremity. There there is some very slight elevatus noted to the first ray.  I did explain to the patient that it was very difficult to get the first ray plantar flexed as much as possible intraoperatively however there was some residual elevatus even after fixation despite multiple attempts to plantarflex the first ray during fixation.   Radiographic exam Orthopedic hardware is intact.  Overall there is very good alignment of the first ray and the bone allograft is intact with good demonstration of healing and incorporation of good arthrodesis and solid ball   Assessment: 1. s/p first MTPJ arthrodesis with allograft. DOS: 11/14/2020   Plan of Care:  1. Patient was evaluated.  X-rays reviewed today.  Good alignment of the first ray 2.  Patient may now slowly increase to full activity no restrictions. 3.  Continue compression socks daily 4.  The patient continues to get some improvement with gait training through physical therapy.  Continue as needed 5.  Return to clinic as needed  *Wants to be able to walk his daughter down the aisle; March 01, 2021  Felecia Shelling, DPM [facility]  Dr. Felecia Shelling, DPM    [address] [place], [place] [address]                 Office [phone]  Fax [phone]

## 2021-03-03 DIAGNOSIS — R2689 Other abnormalities of gait and mobility: Secondary | ICD-10-CM | POA: Diagnosis not present

## 2021-03-03 DIAGNOSIS — G249 Dystonia, unspecified: Secondary | ICD-10-CM | POA: Diagnosis not present

## 2021-03-03 DIAGNOSIS — M79672 Pain in left foot: Secondary | ICD-10-CM | POA: Diagnosis not present

## 2021-03-06 DIAGNOSIS — G249 Dystonia, unspecified: Secondary | ICD-10-CM | POA: Diagnosis not present

## 2021-03-06 DIAGNOSIS — R2689 Other abnormalities of gait and mobility: Secondary | ICD-10-CM | POA: Diagnosis not present

## 2021-03-06 DIAGNOSIS — M79672 Pain in left foot: Secondary | ICD-10-CM | POA: Diagnosis not present

## 2021-03-10 DIAGNOSIS — G249 Dystonia, unspecified: Secondary | ICD-10-CM | POA: Diagnosis not present

## 2021-03-10 DIAGNOSIS — R2689 Other abnormalities of gait and mobility: Secondary | ICD-10-CM | POA: Diagnosis not present

## 2021-03-10 DIAGNOSIS — M79672 Pain in left foot: Secondary | ICD-10-CM | POA: Diagnosis not present

## 2021-03-12 DIAGNOSIS — R2689 Other abnormalities of gait and mobility: Secondary | ICD-10-CM | POA: Diagnosis not present

## 2021-03-12 DIAGNOSIS — M79672 Pain in left foot: Secondary | ICD-10-CM | POA: Diagnosis not present

## 2021-03-12 DIAGNOSIS — G249 Dystonia, unspecified: Secondary | ICD-10-CM | POA: Diagnosis not present

## 2021-03-17 DIAGNOSIS — R2689 Other abnormalities of gait and mobility: Secondary | ICD-10-CM | POA: Diagnosis not present

## 2021-03-17 DIAGNOSIS — G249 Dystonia, unspecified: Secondary | ICD-10-CM | POA: Diagnosis not present

## 2021-03-17 DIAGNOSIS — M79672 Pain in left foot: Secondary | ICD-10-CM | POA: Diagnosis not present

## 2021-03-19 DIAGNOSIS — M79672 Pain in left foot: Secondary | ICD-10-CM | POA: Diagnosis not present

## 2021-03-19 DIAGNOSIS — G249 Dystonia, unspecified: Secondary | ICD-10-CM | POA: Diagnosis not present

## 2021-03-19 DIAGNOSIS — R2689 Other abnormalities of gait and mobility: Secondary | ICD-10-CM | POA: Diagnosis not present

## 2021-03-24 DIAGNOSIS — G249 Dystonia, unspecified: Secondary | ICD-10-CM | POA: Diagnosis not present

## 2021-03-24 DIAGNOSIS — M79672 Pain in left foot: Secondary | ICD-10-CM | POA: Diagnosis not present

## 2021-03-24 DIAGNOSIS — R2689 Other abnormalities of gait and mobility: Secondary | ICD-10-CM | POA: Diagnosis not present

## 2021-03-26 DIAGNOSIS — G249 Dystonia, unspecified: Secondary | ICD-10-CM | POA: Diagnosis not present

## 2021-03-26 DIAGNOSIS — R2689 Other abnormalities of gait and mobility: Secondary | ICD-10-CM | POA: Diagnosis not present

## 2021-03-26 DIAGNOSIS — M79672 Pain in left foot: Secondary | ICD-10-CM | POA: Diagnosis not present

## 2021-03-28 DIAGNOSIS — N183 Chronic kidney disease, stage 3 unspecified: Secondary | ICD-10-CM | POA: Diagnosis not present

## 2021-03-28 DIAGNOSIS — E782 Mixed hyperlipidemia: Secondary | ICD-10-CM | POA: Diagnosis not present

## 2021-03-28 DIAGNOSIS — R600 Localized edema: Secondary | ICD-10-CM | POA: Diagnosis not present

## 2021-03-28 DIAGNOSIS — Z23 Encounter for immunization: Secondary | ICD-10-CM | POA: Diagnosis not present

## 2021-03-28 DIAGNOSIS — I129 Hypertensive chronic kidney disease with stage 1 through stage 4 chronic kidney disease, or unspecified chronic kidney disease: Secondary | ICD-10-CM | POA: Diagnosis not present

## 2021-04-01 ENCOUNTER — Other Ambulatory Visit (HOSPITAL_COMMUNITY): Payer: Self-pay | Admitting: Family Medicine

## 2021-04-01 DIAGNOSIS — M79672 Pain in left foot: Secondary | ICD-10-CM | POA: Diagnosis not present

## 2021-04-01 DIAGNOSIS — G249 Dystonia, unspecified: Secondary | ICD-10-CM | POA: Diagnosis not present

## 2021-04-01 DIAGNOSIS — R600 Localized edema: Secondary | ICD-10-CM

## 2021-04-01 DIAGNOSIS — R2689 Other abnormalities of gait and mobility: Secondary | ICD-10-CM | POA: Diagnosis not present

## 2021-04-07 DIAGNOSIS — G249 Dystonia, unspecified: Secondary | ICD-10-CM | POA: Diagnosis not present

## 2021-04-07 DIAGNOSIS — M79672 Pain in left foot: Secondary | ICD-10-CM | POA: Diagnosis not present

## 2021-04-07 DIAGNOSIS — R2689 Other abnormalities of gait and mobility: Secondary | ICD-10-CM | POA: Diagnosis not present

## 2021-04-09 DIAGNOSIS — M79672 Pain in left foot: Secondary | ICD-10-CM | POA: Diagnosis not present

## 2021-04-09 DIAGNOSIS — R2689 Other abnormalities of gait and mobility: Secondary | ICD-10-CM | POA: Diagnosis not present

## 2021-04-09 DIAGNOSIS — G249 Dystonia, unspecified: Secondary | ICD-10-CM | POA: Diagnosis not present

## 2021-04-11 ENCOUNTER — Encounter (HOSPITAL_COMMUNITY): Payer: Self-pay | Admitting: Family Medicine

## 2021-04-14 DIAGNOSIS — G249 Dystonia, unspecified: Secondary | ICD-10-CM | POA: Diagnosis not present

## 2021-04-14 DIAGNOSIS — M79672 Pain in left foot: Secondary | ICD-10-CM | POA: Diagnosis not present

## 2021-04-14 DIAGNOSIS — R2689 Other abnormalities of gait and mobility: Secondary | ICD-10-CM | POA: Diagnosis not present

## 2021-04-16 DIAGNOSIS — M79672 Pain in left foot: Secondary | ICD-10-CM | POA: Diagnosis not present

## 2021-04-16 DIAGNOSIS — R2689 Other abnormalities of gait and mobility: Secondary | ICD-10-CM | POA: Diagnosis not present

## 2021-04-16 DIAGNOSIS — G249 Dystonia, unspecified: Secondary | ICD-10-CM | POA: Diagnosis not present

## 2021-04-22 DIAGNOSIS — G249 Dystonia, unspecified: Secondary | ICD-10-CM | POA: Diagnosis not present

## 2021-04-22 DIAGNOSIS — R2689 Other abnormalities of gait and mobility: Secondary | ICD-10-CM | POA: Diagnosis not present

## 2021-04-22 DIAGNOSIS — M79672 Pain in left foot: Secondary | ICD-10-CM | POA: Diagnosis not present

## 2021-04-24 DIAGNOSIS — M79672 Pain in left foot: Secondary | ICD-10-CM | POA: Diagnosis not present

## 2021-04-24 DIAGNOSIS — R2689 Other abnormalities of gait and mobility: Secondary | ICD-10-CM | POA: Diagnosis not present

## 2021-04-24 DIAGNOSIS — G249 Dystonia, unspecified: Secondary | ICD-10-CM | POA: Diagnosis not present

## 2021-04-30 DIAGNOSIS — M79672 Pain in left foot: Secondary | ICD-10-CM | POA: Diagnosis not present

## 2021-04-30 DIAGNOSIS — R2689 Other abnormalities of gait and mobility: Secondary | ICD-10-CM | POA: Diagnosis not present

## 2021-04-30 DIAGNOSIS — G249 Dystonia, unspecified: Secondary | ICD-10-CM | POA: Diagnosis not present

## 2021-05-14 DIAGNOSIS — R2689 Other abnormalities of gait and mobility: Secondary | ICD-10-CM | POA: Diagnosis not present

## 2021-05-14 DIAGNOSIS — M79672 Pain in left foot: Secondary | ICD-10-CM | POA: Diagnosis not present

## 2021-05-14 DIAGNOSIS — G249 Dystonia, unspecified: Secondary | ICD-10-CM | POA: Diagnosis not present

## 2021-05-19 DIAGNOSIS — M79672 Pain in left foot: Secondary | ICD-10-CM | POA: Diagnosis not present

## 2021-05-19 DIAGNOSIS — G249 Dystonia, unspecified: Secondary | ICD-10-CM | POA: Diagnosis not present

## 2021-05-19 DIAGNOSIS — R2689 Other abnormalities of gait and mobility: Secondary | ICD-10-CM | POA: Diagnosis not present

## 2021-05-20 ENCOUNTER — Ambulatory Visit (HOSPITAL_COMMUNITY): Payer: Medicare PPO | Attending: Cardiology

## 2021-05-20 ENCOUNTER — Other Ambulatory Visit: Payer: Self-pay

## 2021-05-20 DIAGNOSIS — R600 Localized edema: Secondary | ICD-10-CM

## 2021-05-20 LAB — ECHOCARDIOGRAM COMPLETE
Area-P 1/2: 3.26 cm2
S' Lateral: 2.4 cm

## 2021-05-21 DIAGNOSIS — G249 Dystonia, unspecified: Secondary | ICD-10-CM | POA: Diagnosis not present

## 2021-05-21 DIAGNOSIS — M79672 Pain in left foot: Secondary | ICD-10-CM | POA: Diagnosis not present

## 2021-05-21 DIAGNOSIS — R2689 Other abnormalities of gait and mobility: Secondary | ICD-10-CM | POA: Diagnosis not present

## 2021-05-27 DIAGNOSIS — G249 Dystonia, unspecified: Secondary | ICD-10-CM | POA: Diagnosis not present

## 2021-05-27 DIAGNOSIS — R2689 Other abnormalities of gait and mobility: Secondary | ICD-10-CM | POA: Diagnosis not present

## 2021-05-27 DIAGNOSIS — M79672 Pain in left foot: Secondary | ICD-10-CM | POA: Diagnosis not present

## 2021-05-30 DIAGNOSIS — M79672 Pain in left foot: Secondary | ICD-10-CM | POA: Diagnosis not present

## 2021-05-30 DIAGNOSIS — R2689 Other abnormalities of gait and mobility: Secondary | ICD-10-CM | POA: Diagnosis not present

## 2021-05-30 DIAGNOSIS — G249 Dystonia, unspecified: Secondary | ICD-10-CM | POA: Diagnosis not present

## 2021-06-05 DIAGNOSIS — M79672 Pain in left foot: Secondary | ICD-10-CM | POA: Diagnosis not present

## 2021-06-05 DIAGNOSIS — R2689 Other abnormalities of gait and mobility: Secondary | ICD-10-CM | POA: Diagnosis not present

## 2021-06-05 DIAGNOSIS — G249 Dystonia, unspecified: Secondary | ICD-10-CM | POA: Diagnosis not present

## 2021-06-17 DIAGNOSIS — G249 Dystonia, unspecified: Secondary | ICD-10-CM | POA: Diagnosis not present

## 2021-06-17 DIAGNOSIS — R2689 Other abnormalities of gait and mobility: Secondary | ICD-10-CM | POA: Diagnosis not present

## 2021-06-17 DIAGNOSIS — M79672 Pain in left foot: Secondary | ICD-10-CM | POA: Diagnosis not present

## 2021-06-19 DIAGNOSIS — G249 Dystonia, unspecified: Secondary | ICD-10-CM | POA: Diagnosis not present

## 2021-06-19 DIAGNOSIS — M79672 Pain in left foot: Secondary | ICD-10-CM | POA: Diagnosis not present

## 2021-06-19 DIAGNOSIS — R2689 Other abnormalities of gait and mobility: Secondary | ICD-10-CM | POA: Diagnosis not present

## 2021-06-24 DIAGNOSIS — M79672 Pain in left foot: Secondary | ICD-10-CM | POA: Diagnosis not present

## 2021-06-24 DIAGNOSIS — G249 Dystonia, unspecified: Secondary | ICD-10-CM | POA: Diagnosis not present

## 2021-06-24 DIAGNOSIS — R2689 Other abnormalities of gait and mobility: Secondary | ICD-10-CM | POA: Diagnosis not present

## 2021-06-26 DIAGNOSIS — G249 Dystonia, unspecified: Secondary | ICD-10-CM | POA: Diagnosis not present

## 2021-06-26 DIAGNOSIS — M79672 Pain in left foot: Secondary | ICD-10-CM | POA: Diagnosis not present

## 2021-06-26 DIAGNOSIS — R2689 Other abnormalities of gait and mobility: Secondary | ICD-10-CM | POA: Diagnosis not present

## 2021-07-01 DIAGNOSIS — R2689 Other abnormalities of gait and mobility: Secondary | ICD-10-CM | POA: Diagnosis not present

## 2021-07-01 DIAGNOSIS — G249 Dystonia, unspecified: Secondary | ICD-10-CM | POA: Diagnosis not present

## 2021-07-01 DIAGNOSIS — M79672 Pain in left foot: Secondary | ICD-10-CM | POA: Diagnosis not present

## 2021-07-04 DIAGNOSIS — R2689 Other abnormalities of gait and mobility: Secondary | ICD-10-CM | POA: Diagnosis not present

## 2021-07-04 DIAGNOSIS — M79672 Pain in left foot: Secondary | ICD-10-CM | POA: Diagnosis not present

## 2021-07-04 DIAGNOSIS — G249 Dystonia, unspecified: Secondary | ICD-10-CM | POA: Diagnosis not present

## 2021-07-08 DIAGNOSIS — R2689 Other abnormalities of gait and mobility: Secondary | ICD-10-CM | POA: Diagnosis not present

## 2021-07-08 DIAGNOSIS — G249 Dystonia, unspecified: Secondary | ICD-10-CM | POA: Diagnosis not present

## 2021-07-08 DIAGNOSIS — M79672 Pain in left foot: Secondary | ICD-10-CM | POA: Diagnosis not present

## 2021-07-10 ENCOUNTER — Other Ambulatory Visit (HOSPITAL_COMMUNITY): Payer: Self-pay | Admitting: Family Medicine

## 2021-07-10 ENCOUNTER — Ambulatory Visit (INDEPENDENT_AMBULATORY_CARE_PROVIDER_SITE_OTHER)
Admission: RE | Admit: 2021-07-10 | Discharge: 2021-07-10 | Disposition: A | Discharge status: Home / Self Care | Payer: Medicare PPO | Source: Ambulatory Visit | Attending: Advanced Practice Midwife | Admitting: Advanced Practice Midwife

## 2021-07-10 ENCOUNTER — Ambulatory Visit (HOSPITAL_COMMUNITY)
Admission: RE | Admit: 2021-07-10 | Discharge: 2021-07-10 | Disposition: A | Discharge status: Home / Self Care | Payer: Medicare PPO | Source: Ambulatory Visit | Attending: Advanced Practice Midwife | Admitting: Advanced Practice Midwife

## 2021-07-10 ENCOUNTER — Other Ambulatory Visit: Payer: Self-pay

## 2021-07-10 DIAGNOSIS — M79672 Pain in left foot: Secondary | ICD-10-CM

## 2021-07-10 DIAGNOSIS — R609 Edema, unspecified: Secondary | ICD-10-CM

## 2021-07-11 DIAGNOSIS — G249 Dystonia, unspecified: Secondary | ICD-10-CM | POA: Diagnosis not present

## 2021-07-11 DIAGNOSIS — M79672 Pain in left foot: Secondary | ICD-10-CM | POA: Diagnosis not present

## 2021-07-11 DIAGNOSIS — R2689 Other abnormalities of gait and mobility: Secondary | ICD-10-CM | POA: Diagnosis not present

## 2021-07-15 DIAGNOSIS — M79672 Pain in left foot: Secondary | ICD-10-CM | POA: Diagnosis not present

## 2021-07-15 DIAGNOSIS — R2689 Other abnormalities of gait and mobility: Secondary | ICD-10-CM | POA: Diagnosis not present

## 2021-07-15 DIAGNOSIS — G249 Dystonia, unspecified: Secondary | ICD-10-CM | POA: Diagnosis not present

## 2021-07-17 DIAGNOSIS — M79672 Pain in left foot: Secondary | ICD-10-CM | POA: Diagnosis not present

## 2021-07-17 DIAGNOSIS — G249 Dystonia, unspecified: Secondary | ICD-10-CM | POA: Diagnosis not present

## 2021-07-17 DIAGNOSIS — R2689 Other abnormalities of gait and mobility: Secondary | ICD-10-CM | POA: Diagnosis not present

## 2021-07-22 DIAGNOSIS — M79672 Pain in left foot: Secondary | ICD-10-CM | POA: Diagnosis not present

## 2021-07-22 DIAGNOSIS — R2689 Other abnormalities of gait and mobility: Secondary | ICD-10-CM | POA: Diagnosis not present

## 2021-07-22 DIAGNOSIS — G249 Dystonia, unspecified: Secondary | ICD-10-CM | POA: Diagnosis not present

## 2021-07-24 DIAGNOSIS — R2689 Other abnormalities of gait and mobility: Secondary | ICD-10-CM | POA: Diagnosis not present

## 2021-07-24 DIAGNOSIS — G249 Dystonia, unspecified: Secondary | ICD-10-CM | POA: Diagnosis not present

## 2021-07-24 DIAGNOSIS — M79672 Pain in left foot: Secondary | ICD-10-CM | POA: Diagnosis not present

## 2021-07-29 DIAGNOSIS — R2689 Other abnormalities of gait and mobility: Secondary | ICD-10-CM | POA: Diagnosis not present

## 2021-07-29 DIAGNOSIS — M79672 Pain in left foot: Secondary | ICD-10-CM | POA: Diagnosis not present

## 2021-07-29 DIAGNOSIS — G249 Dystonia, unspecified: Secondary | ICD-10-CM | POA: Diagnosis not present

## 2021-07-31 DIAGNOSIS — G249 Dystonia, unspecified: Secondary | ICD-10-CM | POA: Diagnosis not present

## 2021-07-31 DIAGNOSIS — R2689 Other abnormalities of gait and mobility: Secondary | ICD-10-CM | POA: Diagnosis not present

## 2021-07-31 DIAGNOSIS — M79672 Pain in left foot: Secondary | ICD-10-CM | POA: Diagnosis not present

## 2021-08-05 DIAGNOSIS — M79672 Pain in left foot: Secondary | ICD-10-CM | POA: Diagnosis not present

## 2021-08-05 DIAGNOSIS — G249 Dystonia, unspecified: Secondary | ICD-10-CM | POA: Diagnosis not present

## 2021-08-05 DIAGNOSIS — R2689 Other abnormalities of gait and mobility: Secondary | ICD-10-CM | POA: Diagnosis not present

## 2021-08-06 ENCOUNTER — Ambulatory Visit: Payer: Medicare PPO | Admitting: Vascular Surgery

## 2021-08-06 ENCOUNTER — Encounter: Payer: Self-pay | Admitting: Vascular Surgery

## 2021-08-06 VITALS — BP 130/77 | HR 71 | Temp 97.9°F | Resp 18 | Ht 68.0 in | Wt 196.0 lb

## 2021-08-06 DIAGNOSIS — I872 Venous insufficiency (chronic) (peripheral): Secondary | ICD-10-CM

## 2021-08-06 NOTE — Progress Notes (Signed)
? ?ASSESSMENT & PLAN  ? ?CHRONIC VENOUS INSUFFICIENCY: This patient has significant leg swelling likely related to significant chronic venous insufficiency.  He has both deep venous reflux and superficial venous reflux.  He has CEAP C3 venous disease.  We have discussed the importance of intermittent leg elevation and the proper positioning for this.  He will continue to try his knee-high compression stockings but if his symptoms do not improve that he will call to get fitted for thigh-high compression stockings with a gradient of 20 to 30 mmHg.  I have encouraged him to stay active.  Of note his symptoms improve when he is doing physical therapy.  [facility] and I explained that maintaining muscle mass certainly helps with the efficiency of the calf muscle pump.  I have encouraged him to avoid prolonged sitting and standing.  He has been sitting a lot lately which is contributing to his swelling.  We also discussed the importance of water aerobics and [facility] does [facility] to [facility] [employer] so this is certainly an option.  Fortunately he has a healthy weight.  If his swelling and symptoms do not improve then he will try the thigh-high stockings.  If he continues to have symptoms then I think he would be a good candidate for laser ablation of the left great saphenous vein down to the distal thigh.  He will call if his symptoms progress. ? ?In addition, I think the swelling in the toe is likely related to a component of lymphedema given his 2 operations on the toe.  Elevation as I discussed with him should help with this also. ? ?REASON FOR CONSULT:   ? ?Left calf swelling and left great toe swelling.  The consult is requested by Dr. Lupita RaiderKimberlee Shaw. ? ?HPI:  ? ?Joseph Richards is a 76 y.o. male who was referred with left lower extremity swelling.  I have reviewed the records from the referring office.  The patient was seen on 03/28/2021.  He is followed with hypertension, hypercholesterolemia, and chronic  kidney disease.  He was also noted to have significant leg edema and is sent for vascular consultation. ? ?On my history the patient has had 2 operations on the left great toe and has had some chronic swelling in the toe since that time.  He also complains of swelling in the calf.  He describes aching pain and heaviness in the leg which is aggravated by standing and relieved somewhat with elevation.  He has been wearing knee-high compression stockings.  He has no previous history of DVT.  He is had no previous venous procedures.  His symptoms are worse at the end of the day.  He does not have any significant symptoms on the right side. ? ?Past Medical History:  ?Diagnosis Date  ? Dystonia   ? GERD (gastroesophageal reflux disease)   ? Hyperlipidemia   ? Hypertension   ? ? ?Family History  ?Problem Relation Age of Onset  ? Aneurysm Mother   ?     brain  ? Lung cancer Father   ? Heart attack Father   ? Breast cancer Sister   ? Healthy Daughter   ? ? ?SOCIAL HISTORY: ?Social History  ? ?Tobacco Use  ? Smoking status: Former  ? Smokeless tobacco: Current  ?  Types: Chew  ?Substance Use Topics  ? Alcohol use: Yes  ?  Comment: 1 drink every couple months  ? ? ?No Known Allergies ? ?Current Outpatient  Medications  ?Medication Sig Dispense Refill  ? aspirin EC 81 MG tablet Take 81 mg by mouth daily.    ? MULTIPLE VITAMIN PO Take by mouth.    ? Omega-3 Fatty Acids (FISH OIL) 1000 MG CAPS Take by mouth.    ? omeprazole (PRILOSEC) 40 MG capsule     ? pravastatin (PRAVACHOL) 20 MG tablet     ? ibuprofen (ADVIL) 800 MG tablet TAKE 1 TABLET BY MOUTH THREE TIMES A DAY (Patient not taking: Reported on 08/06/2021) 90 tablet 1  ? ?No current facility-administered medications for this visit.  ? ? ?REVIEW OF SYSTEMS:  ?[X]  denotes positive finding, [ ]  denotes negative finding ?Cardiac  Comments:  ?Chest pain or chest pressure:    ?Shortness of breath upon exertion:    ?Short of breath when lying flat:    ?Irregular heart rhythm:    ?     ?Vascular    ?Pain in calf, thigh, or hip brought on by ambulation:    ?Pain in feet at night that wakes you up from your sleep:     ?Blood clot in your veins:    ?Leg swelling:  x   ?    ?Pulmonary    ?Oxygen at home:    ?Productive cough:     ?Wheezing:     ?    ?Neurologic    ?Sudden weakness in arms or legs:     ?Sudden numbness in arms or legs:     ?Sudden onset of difficulty speaking or slurred speech:    ?Temporary loss of vision in one eye:     ?Problems with dizziness:     ?    ?Gastrointestinal    ?Blood in stool:     ?Vomited blood:     ?    ?Genitourinary    ?Burning when urinating:     ?Blood in urine:    ?    ?Psychiatric    ?Major depression:     ?    ?Hematologic    ?Bleeding problems:    ?Problems with blood clotting too easily:    ?    ?Skin    ?Rashes or ulcers:    ?    ?Constitutional    ?Fever or chills:    ?- ? ?PHYSICAL EXAM:  ? ?Vitals:  ? 08/06/21 1515  ?BP: 130/77  ?Pulse: 71  ?Resp: 18  ?Temp: 97.9 ?F (36.6 ?C)  ?TempSrc: Temporal  ?SpO2: 98%  ?Weight: 196 lb (88.9 kg)  ?Height: 5\' 8"  (1.727 m)  ? ?Body mass index is 29.8 kg/m?. ? ?GENERAL: The patient is a well-nourished male, in no acute distress. The vital signs are documented above. ?CARDIAC: There is a regular rate and rhythm.  ?VASCULAR: I do not detect carotid bruits. ?He has palpable dorsalis pedis pulses. ?He has left calf swelling and swelling in the left great toe. ? ? ?I did look at the left great saphenous vein myself with the SonoSite and it is dilated throughout.  He has significant reflux throughout. ? ?PULMONARY: There is good air exchange bilaterally without wheezing or rales. ?ABDOMEN: Soft and non-tender with normal pitched bowel sounds.  ?MUSCULOSKELETAL: There are no major deformities. ?NEUROLOGIC: No focal weakness or paresthesias are detected. ?SKIN: There are no ulcers or rashes noted. ?PSYCHIATRIC: The patient has a normal affect. ? ?DATA:   ? ?VENOUS DUPLEX: I have independently interpreted the venous duplex  scan that was done on 07/10/2021.  This was of the left lower  extremity only. ? ?There was no evidence of DVT. ? ?There was deep venous reflux involving the common femoral vein and femoral vein. ? ?There was superficial venous reflux from the saphenofemoral junction to the knee.  Diameters of the vein ranged from 5-6 mm.  There was also superficial venous reflux in the small saphenous vein from the saphenous popliteal junction to the mid calf.  The vein measured about 4 mm in diameter. ? ? ? ?ARTERIAL DOPPLER STUDY: I have also reviewed the arterial Doppler study that was done on 07/10/2021. ? ?On the right side there was a triphasic dorsalis pedis and posterior tibial signal.  ABI was 100%.  Toe pressure was 96 mmHg. ? ?On the left side there was a triphasic dorsalis pedis and posterior tibial signal.  ABI was 100%.  Toe pressure was 79 mmHg. ? ?A total of 60 minutes was spent on this visit. 30 minutes was face to face time. More than 50% of the time was spent on counseling and coordinating with the patient.  ? ? ?Waverly Ferrari ?[facility] ?

## 2021-08-07 DIAGNOSIS — M79672 Pain in left foot: Secondary | ICD-10-CM | POA: Diagnosis not present

## 2021-08-07 DIAGNOSIS — G249 Dystonia, unspecified: Secondary | ICD-10-CM | POA: Diagnosis not present

## 2021-08-07 DIAGNOSIS — R2689 Other abnormalities of gait and mobility: Secondary | ICD-10-CM | POA: Diagnosis not present

## 2021-08-11 DIAGNOSIS — M7989 Other specified soft tissue disorders: Secondary | ICD-10-CM

## 2021-08-14 DIAGNOSIS — R2689 Other abnormalities of gait and mobility: Secondary | ICD-10-CM | POA: Diagnosis not present

## 2021-08-14 DIAGNOSIS — G249 Dystonia, unspecified: Secondary | ICD-10-CM | POA: Diagnosis not present

## 2021-08-14 DIAGNOSIS — M79672 Pain in left foot: Secondary | ICD-10-CM | POA: Diagnosis not present

## 2021-08-19 DIAGNOSIS — M79672 Pain in left foot: Secondary | ICD-10-CM | POA: Diagnosis not present

## 2021-08-19 DIAGNOSIS — R2689 Other abnormalities of gait and mobility: Secondary | ICD-10-CM | POA: Diagnosis not present

## 2021-08-19 DIAGNOSIS — G249 Dystonia, unspecified: Secondary | ICD-10-CM | POA: Diagnosis not present

## 2021-08-22 DIAGNOSIS — M79672 Pain in left foot: Secondary | ICD-10-CM | POA: Diagnosis not present

## 2021-08-22 DIAGNOSIS — G249 Dystonia, unspecified: Secondary | ICD-10-CM | POA: Diagnosis not present

## 2021-08-22 DIAGNOSIS — R2689 Other abnormalities of gait and mobility: Secondary | ICD-10-CM | POA: Diagnosis not present

## 2021-08-25 DIAGNOSIS — M79672 Pain in left foot: Secondary | ICD-10-CM | POA: Diagnosis not present

## 2021-08-25 DIAGNOSIS — R2689 Other abnormalities of gait and mobility: Secondary | ICD-10-CM | POA: Diagnosis not present

## 2021-08-25 DIAGNOSIS — G249 Dystonia, unspecified: Secondary | ICD-10-CM | POA: Diagnosis not present

## 2021-09-03 DIAGNOSIS — M79672 Pain in left foot: Secondary | ICD-10-CM | POA: Diagnosis not present

## 2021-09-03 DIAGNOSIS — G249 Dystonia, unspecified: Secondary | ICD-10-CM | POA: Diagnosis not present

## 2021-09-03 DIAGNOSIS — R2689 Other abnormalities of gait and mobility: Secondary | ICD-10-CM | POA: Diagnosis not present

## 2021-09-08 DIAGNOSIS — R2689 Other abnormalities of gait and mobility: Secondary | ICD-10-CM | POA: Diagnosis not present

## 2021-09-08 DIAGNOSIS — G249 Dystonia, unspecified: Secondary | ICD-10-CM | POA: Diagnosis not present

## 2021-09-08 DIAGNOSIS — M79672 Pain in left foot: Secondary | ICD-10-CM | POA: Diagnosis not present

## 2021-09-10 DIAGNOSIS — M79672 Pain in left foot: Secondary | ICD-10-CM | POA: Diagnosis not present

## 2021-09-10 DIAGNOSIS — R2689 Other abnormalities of gait and mobility: Secondary | ICD-10-CM | POA: Diagnosis not present

## 2021-09-10 DIAGNOSIS — G249 Dystonia, unspecified: Secondary | ICD-10-CM | POA: Diagnosis not present

## 2021-09-17 DIAGNOSIS — R2689 Other abnormalities of gait and mobility: Secondary | ICD-10-CM | POA: Diagnosis not present

## 2021-09-17 DIAGNOSIS — G249 Dystonia, unspecified: Secondary | ICD-10-CM | POA: Diagnosis not present

## 2021-09-17 DIAGNOSIS — M79672 Pain in left foot: Secondary | ICD-10-CM | POA: Diagnosis not present

## 2021-09-26 DIAGNOSIS — R2689 Other abnormalities of gait and mobility: Secondary | ICD-10-CM | POA: Diagnosis not present

## 2021-09-26 DIAGNOSIS — M79672 Pain in left foot: Secondary | ICD-10-CM | POA: Diagnosis not present

## 2021-09-26 DIAGNOSIS — G249 Dystonia, unspecified: Secondary | ICD-10-CM | POA: Diagnosis not present

## 2021-09-30 DIAGNOSIS — G249 Dystonia, unspecified: Secondary | ICD-10-CM | POA: Diagnosis not present

## 2021-09-30 DIAGNOSIS — M79672 Pain in left foot: Secondary | ICD-10-CM | POA: Diagnosis not present

## 2021-09-30 DIAGNOSIS — R2689 Other abnormalities of gait and mobility: Secondary | ICD-10-CM | POA: Diagnosis not present

## 2021-10-03 DIAGNOSIS — R2689 Other abnormalities of gait and mobility: Secondary | ICD-10-CM | POA: Diagnosis not present

## 2021-10-03 DIAGNOSIS — G249 Dystonia, unspecified: Secondary | ICD-10-CM | POA: Diagnosis not present

## 2021-10-03 DIAGNOSIS — M79672 Pain in left foot: Secondary | ICD-10-CM | POA: Diagnosis not present

## 2021-10-08 DIAGNOSIS — G249 Dystonia, unspecified: Secondary | ICD-10-CM | POA: Diagnosis not present

## 2021-10-08 DIAGNOSIS — R2689 Other abnormalities of gait and mobility: Secondary | ICD-10-CM | POA: Diagnosis not present

## 2021-10-08 DIAGNOSIS — M79672 Pain in left foot: Secondary | ICD-10-CM | POA: Diagnosis not present

## 2021-10-10 DIAGNOSIS — F430 Acute stress reaction: Secondary | ICD-10-CM | POA: Diagnosis not present

## 2021-10-10 DIAGNOSIS — N529 Male erectile dysfunction, unspecified: Secondary | ICD-10-CM | POA: Diagnosis not present

## 2021-10-10 DIAGNOSIS — M79672 Pain in left foot: Secondary | ICD-10-CM | POA: Diagnosis not present

## 2021-10-10 DIAGNOSIS — Z0000 Encounter for general adult medical examination without abnormal findings: Secondary | ICD-10-CM | POA: Diagnosis not present

## 2021-10-10 DIAGNOSIS — G249 Dystonia, unspecified: Secondary | ICD-10-CM | POA: Diagnosis not present

## 2021-10-10 DIAGNOSIS — R2689 Other abnormalities of gait and mobility: Secondary | ICD-10-CM | POA: Diagnosis not present

## 2021-10-10 DIAGNOSIS — K219 Gastro-esophageal reflux disease without esophagitis: Secondary | ICD-10-CM | POA: Diagnosis not present

## 2021-10-10 DIAGNOSIS — R270 Ataxia, unspecified: Secondary | ICD-10-CM | POA: Diagnosis not present

## 2021-10-10 DIAGNOSIS — E782 Mixed hyperlipidemia: Secondary | ICD-10-CM | POA: Diagnosis not present

## 2021-10-10 DIAGNOSIS — N183 Chronic kidney disease, stage 3 unspecified: Secondary | ICD-10-CM | POA: Diagnosis not present

## 2021-10-10 DIAGNOSIS — I129 Hypertensive chronic kidney disease with stage 1 through stage 4 chronic kidney disease, or unspecified chronic kidney disease: Secondary | ICD-10-CM | POA: Diagnosis not present

## 2021-10-16 DIAGNOSIS — M79672 Pain in left foot: Secondary | ICD-10-CM | POA: Diagnosis not present

## 2021-10-16 DIAGNOSIS — R2689 Other abnormalities of gait and mobility: Secondary | ICD-10-CM | POA: Diagnosis not present

## 2021-10-16 DIAGNOSIS — G249 Dystonia, unspecified: Secondary | ICD-10-CM | POA: Diagnosis not present

## 2021-10-21 DIAGNOSIS — G249 Dystonia, unspecified: Secondary | ICD-10-CM | POA: Diagnosis not present

## 2021-10-21 DIAGNOSIS — R2689 Other abnormalities of gait and mobility: Secondary | ICD-10-CM | POA: Diagnosis not present

## 2021-10-21 DIAGNOSIS — M79672 Pain in left foot: Secondary | ICD-10-CM | POA: Diagnosis not present

## 2021-10-23 DIAGNOSIS — R2689 Other abnormalities of gait and mobility: Secondary | ICD-10-CM | POA: Diagnosis not present

## 2021-10-23 DIAGNOSIS — M79672 Pain in left foot: Secondary | ICD-10-CM | POA: Diagnosis not present

## 2021-10-23 DIAGNOSIS — G249 Dystonia, unspecified: Secondary | ICD-10-CM | POA: Diagnosis not present

## 2021-10-28 DIAGNOSIS — M79672 Pain in left foot: Secondary | ICD-10-CM | POA: Diagnosis not present

## 2021-10-28 DIAGNOSIS — G249 Dystonia, unspecified: Secondary | ICD-10-CM | POA: Diagnosis not present

## 2021-10-28 DIAGNOSIS — R2689 Other abnormalities of gait and mobility: Secondary | ICD-10-CM | POA: Diagnosis not present

## 2021-11-04 DIAGNOSIS — G249 Dystonia, unspecified: Secondary | ICD-10-CM | POA: Diagnosis not present

## 2021-11-04 DIAGNOSIS — R2689 Other abnormalities of gait and mobility: Secondary | ICD-10-CM | POA: Diagnosis not present

## 2021-11-04 DIAGNOSIS — M79672 Pain in left foot: Secondary | ICD-10-CM | POA: Diagnosis not present

## 2021-11-07 DIAGNOSIS — G249 Dystonia, unspecified: Secondary | ICD-10-CM | POA: Diagnosis not present

## 2021-11-07 DIAGNOSIS — M79672 Pain in left foot: Secondary | ICD-10-CM | POA: Diagnosis not present

## 2021-11-07 DIAGNOSIS — R2689 Other abnormalities of gait and mobility: Secondary | ICD-10-CM | POA: Diagnosis not present

## 2021-11-12 DIAGNOSIS — R2689 Other abnormalities of gait and mobility: Secondary | ICD-10-CM | POA: Diagnosis not present

## 2021-11-12 DIAGNOSIS — M79672 Pain in left foot: Secondary | ICD-10-CM | POA: Diagnosis not present

## 2021-11-12 DIAGNOSIS — G249 Dystonia, unspecified: Secondary | ICD-10-CM | POA: Diagnosis not present

## 2021-11-14 DIAGNOSIS — G249 Dystonia, unspecified: Secondary | ICD-10-CM | POA: Diagnosis not present

## 2021-11-14 DIAGNOSIS — M79672 Pain in left foot: Secondary | ICD-10-CM | POA: Diagnosis not present

## 2021-11-14 DIAGNOSIS — R2689 Other abnormalities of gait and mobility: Secondary | ICD-10-CM | POA: Diagnosis not present

## 2021-11-18 DIAGNOSIS — M79672 Pain in left foot: Secondary | ICD-10-CM | POA: Diagnosis not present

## 2021-11-18 DIAGNOSIS — G249 Dystonia, unspecified: Secondary | ICD-10-CM | POA: Diagnosis not present

## 2021-11-18 DIAGNOSIS — R2689 Other abnormalities of gait and mobility: Secondary | ICD-10-CM | POA: Diagnosis not present

## 2021-11-25 DIAGNOSIS — M79672 Pain in left foot: Secondary | ICD-10-CM | POA: Diagnosis not present

## 2021-11-25 DIAGNOSIS — R2689 Other abnormalities of gait and mobility: Secondary | ICD-10-CM | POA: Diagnosis not present

## 2021-11-25 DIAGNOSIS — G249 Dystonia, unspecified: Secondary | ICD-10-CM | POA: Diagnosis not present

## 2021-11-27 DIAGNOSIS — R2689 Other abnormalities of gait and mobility: Secondary | ICD-10-CM | POA: Diagnosis not present

## 2021-11-27 DIAGNOSIS — G249 Dystonia, unspecified: Secondary | ICD-10-CM | POA: Diagnosis not present

## 2021-11-27 DIAGNOSIS — M79672 Pain in left foot: Secondary | ICD-10-CM | POA: Diagnosis not present

## 2021-12-02 DIAGNOSIS — G249 Dystonia, unspecified: Secondary | ICD-10-CM | POA: Diagnosis not present

## 2021-12-02 DIAGNOSIS — R2689 Other abnormalities of gait and mobility: Secondary | ICD-10-CM | POA: Diagnosis not present

## 2021-12-02 DIAGNOSIS — M79672 Pain in left foot: Secondary | ICD-10-CM | POA: Diagnosis not present

## 2021-12-04 DIAGNOSIS — G249 Dystonia, unspecified: Secondary | ICD-10-CM | POA: Diagnosis not present

## 2021-12-04 DIAGNOSIS — R2689 Other abnormalities of gait and mobility: Secondary | ICD-10-CM | POA: Diagnosis not present

## 2021-12-04 DIAGNOSIS — M79672 Pain in left foot: Secondary | ICD-10-CM | POA: Diagnosis not present

## 2021-12-08 DIAGNOSIS — M79674 Pain in right toe(s): Secondary | ICD-10-CM | POA: Diagnosis not present

## 2021-12-10 DIAGNOSIS — G249 Dystonia, unspecified: Secondary | ICD-10-CM | POA: Diagnosis not present

## 2021-12-10 DIAGNOSIS — R2689 Other abnormalities of gait and mobility: Secondary | ICD-10-CM | POA: Diagnosis not present

## 2021-12-10 DIAGNOSIS — M79672 Pain in left foot: Secondary | ICD-10-CM | POA: Diagnosis not present

## 2021-12-16 DIAGNOSIS — M79672 Pain in left foot: Secondary | ICD-10-CM | POA: Diagnosis not present

## 2021-12-16 DIAGNOSIS — G249 Dystonia, unspecified: Secondary | ICD-10-CM | POA: Diagnosis not present

## 2021-12-16 DIAGNOSIS — R2689 Other abnormalities of gait and mobility: Secondary | ICD-10-CM | POA: Diagnosis not present

## 2021-12-18 DIAGNOSIS — M79672 Pain in left foot: Secondary | ICD-10-CM | POA: Diagnosis not present

## 2021-12-18 DIAGNOSIS — G249 Dystonia, unspecified: Secondary | ICD-10-CM | POA: Diagnosis not present

## 2021-12-18 DIAGNOSIS — R2689 Other abnormalities of gait and mobility: Secondary | ICD-10-CM | POA: Diagnosis not present

## 2021-12-23 DIAGNOSIS — M79672 Pain in left foot: Secondary | ICD-10-CM | POA: Diagnosis not present

## 2021-12-23 DIAGNOSIS — R2689 Other abnormalities of gait and mobility: Secondary | ICD-10-CM | POA: Diagnosis not present

## 2021-12-23 DIAGNOSIS — G249 Dystonia, unspecified: Secondary | ICD-10-CM | POA: Diagnosis not present

## 2021-12-24 ENCOUNTER — Ambulatory Visit: Payer: Medicare PPO | Admitting: Podiatry

## 2021-12-24 DIAGNOSIS — M79675 Pain in left toe(s): Secondary | ICD-10-CM

## 2021-12-24 DIAGNOSIS — B351 Tinea unguium: Secondary | ICD-10-CM

## 2021-12-24 DIAGNOSIS — L600 Ingrowing nail: Secondary | ICD-10-CM

## 2021-12-24 DIAGNOSIS — M79674 Pain in right toe(s): Secondary | ICD-10-CM

## 2021-12-24 NOTE — Progress Notes (Signed)
   Chief Complaint  Patient presents with   Ingrown Toenail    Patient has  ingrown toe nail on right foot, he has seen pcp and was given abx soaking in epsom salt, toenail still hurts.    SUBJECTIVE Patient presents to office today complaining of elongated, thickened nails that cause pain while ambulating in shoes.  Patient is unable to trim their own nails.  Patient also states that he does have some sensitivity to the lateral aspect of the right great toe concerning for possible ingrown toenail.  He was placed on a round of antibiotics from his PCP.  He is also been soaking his foot in Epsom salt.  Patient is here for further evaluation and treatment.  Past Medical History:  Diagnosis Date   Dystonia    GERD (gastroesophageal reflux disease)    Hyperlipidemia    Hypertension    Past Surgical History:  Procedure Laterality Date   FOOT SURGERY  11/14/2020   HERNIA REPAIR     No Known Allergies  OBJECTIVE General Patient is awake, alert, and oriented x 3 and in no acute distress. Derm Skin is dry and supple bilateral. Negative open lesions or macerations. Remaining integument unremarkable. Nails are tender, long, thickened and dystrophic with subungual debris, consistent with onychomycosis, 1-5 bilateral. No signs of infection noted.  There is an ingrown portion of nail to the right hallux nail plate lateral border. Vasc  DP and PT pedal pulses palpable bilaterally. Temperature gradient within normal limits.  Neuro Epicritic and protective threshold sensation grossly intact bilaterally.  Musculoskeletal Exam No symptomatic pedal deformities noted bilateral. Muscular strength within normal limits.  ASSESSMENT 1.  Pain due to onychomycosis of toenails both 2.  Ingrowing toenail right  PLAN OF CARE 1. Patient evaluated today.  Today we are going to treat the ingrown toenail conservatively.  I do feel that with the patient's presentation simple nail debridement may alleviate the  ingrown toenail. 2. Instructed to maintain good pedal hygiene and foot care.  3. Mechanical debridement of nails 1-5 bilaterally performed using a nail nipper. Filed with dremel without incident.  4. Return to clinic in 3 mos. or sooner if the toenail continues to be bothersome we will proceed with partial nail matricectomy   Felecia Shelling, DPM [facility]  Dr. Felecia Shelling, DPM    [address] [place], [place] [address]                Office [phone]  Fax [phone]

## 2021-12-26 DIAGNOSIS — M79672 Pain in left foot: Secondary | ICD-10-CM | POA: Diagnosis not present

## 2021-12-26 DIAGNOSIS — G249 Dystonia, unspecified: Secondary | ICD-10-CM | POA: Diagnosis not present

## 2021-12-26 DIAGNOSIS — R2689 Other abnormalities of gait and mobility: Secondary | ICD-10-CM | POA: Diagnosis not present

## 2021-12-30 DIAGNOSIS — R2689 Other abnormalities of gait and mobility: Secondary | ICD-10-CM | POA: Diagnosis not present

## 2021-12-30 DIAGNOSIS — G249 Dystonia, unspecified: Secondary | ICD-10-CM | POA: Diagnosis not present

## 2021-12-30 DIAGNOSIS — M79672 Pain in left foot: Secondary | ICD-10-CM | POA: Diagnosis not present

## 2022-01-06 DIAGNOSIS — M79672 Pain in left foot: Secondary | ICD-10-CM | POA: Diagnosis not present

## 2022-01-06 DIAGNOSIS — R2689 Other abnormalities of gait and mobility: Secondary | ICD-10-CM | POA: Diagnosis not present

## 2022-01-06 DIAGNOSIS — G249 Dystonia, unspecified: Secondary | ICD-10-CM | POA: Diagnosis not present

## 2022-01-08 DIAGNOSIS — G249 Dystonia, unspecified: Secondary | ICD-10-CM | POA: Diagnosis not present

## 2022-01-08 DIAGNOSIS — M79672 Pain in left foot: Secondary | ICD-10-CM | POA: Diagnosis not present

## 2022-01-08 DIAGNOSIS — R2689 Other abnormalities of gait and mobility: Secondary | ICD-10-CM | POA: Diagnosis not present

## 2022-01-13 DIAGNOSIS — R2689 Other abnormalities of gait and mobility: Secondary | ICD-10-CM | POA: Diagnosis not present

## 2022-01-13 DIAGNOSIS — M79672 Pain in left foot: Secondary | ICD-10-CM | POA: Diagnosis not present

## 2022-01-13 DIAGNOSIS — G249 Dystonia, unspecified: Secondary | ICD-10-CM | POA: Diagnosis not present

## 2022-01-15 DIAGNOSIS — R2689 Other abnormalities of gait and mobility: Secondary | ICD-10-CM | POA: Diagnosis not present

## 2022-01-15 DIAGNOSIS — G249 Dystonia, unspecified: Secondary | ICD-10-CM | POA: Diagnosis not present

## 2022-01-15 DIAGNOSIS — M79672 Pain in left foot: Secondary | ICD-10-CM | POA: Diagnosis not present

## 2022-01-23 DIAGNOSIS — R2689 Other abnormalities of gait and mobility: Secondary | ICD-10-CM | POA: Diagnosis not present

## 2022-01-23 DIAGNOSIS — G249 Dystonia, unspecified: Secondary | ICD-10-CM | POA: Diagnosis not present

## 2022-01-23 DIAGNOSIS — M79672 Pain in left foot: Secondary | ICD-10-CM | POA: Diagnosis not present

## 2022-01-27 DIAGNOSIS — M79672 Pain in left foot: Secondary | ICD-10-CM | POA: Diagnosis not present

## 2022-01-27 DIAGNOSIS — G249 Dystonia, unspecified: Secondary | ICD-10-CM | POA: Diagnosis not present

## 2022-01-27 DIAGNOSIS — R2689 Other abnormalities of gait and mobility: Secondary | ICD-10-CM | POA: Diagnosis not present

## 2022-01-29 DIAGNOSIS — G249 Dystonia, unspecified: Secondary | ICD-10-CM | POA: Diagnosis not present

## 2022-01-29 DIAGNOSIS — M79672 Pain in left foot: Secondary | ICD-10-CM | POA: Diagnosis not present

## 2022-01-29 DIAGNOSIS — R2689 Other abnormalities of gait and mobility: Secondary | ICD-10-CM | POA: Diagnosis not present

## 2022-02-04 DIAGNOSIS — G249 Dystonia, unspecified: Secondary | ICD-10-CM | POA: Diagnosis not present

## 2022-02-04 DIAGNOSIS — M79672 Pain in left foot: Secondary | ICD-10-CM | POA: Diagnosis not present

## 2022-02-04 DIAGNOSIS — R2689 Other abnormalities of gait and mobility: Secondary | ICD-10-CM | POA: Diagnosis not present

## 2022-02-11 DIAGNOSIS — R2689 Other abnormalities of gait and mobility: Secondary | ICD-10-CM | POA: Diagnosis not present

## 2022-02-11 DIAGNOSIS — Z23 Encounter for immunization: Secondary | ICD-10-CM | POA: Diagnosis not present

## 2022-02-11 DIAGNOSIS — M79672 Pain in left foot: Secondary | ICD-10-CM | POA: Diagnosis not present

## 2022-02-11 DIAGNOSIS — G249 Dystonia, unspecified: Secondary | ICD-10-CM | POA: Diagnosis not present

## 2022-02-18 DIAGNOSIS — M79672 Pain in left foot: Secondary | ICD-10-CM | POA: Diagnosis not present

## 2022-02-18 DIAGNOSIS — R2689 Other abnormalities of gait and mobility: Secondary | ICD-10-CM | POA: Diagnosis not present

## 2022-02-18 DIAGNOSIS — G249 Dystonia, unspecified: Secondary | ICD-10-CM | POA: Diagnosis not present

## 2022-02-25 DIAGNOSIS — M79672 Pain in left foot: Secondary | ICD-10-CM | POA: Diagnosis not present

## 2022-02-25 DIAGNOSIS — R2689 Other abnormalities of gait and mobility: Secondary | ICD-10-CM | POA: Diagnosis not present

## 2022-02-25 DIAGNOSIS — G249 Dystonia, unspecified: Secondary | ICD-10-CM | POA: Diagnosis not present

## 2022-03-16 DIAGNOSIS — G249 Dystonia, unspecified: Secondary | ICD-10-CM | POA: Diagnosis not present

## 2022-03-16 DIAGNOSIS — M6281 Muscle weakness (generalized): Secondary | ICD-10-CM | POA: Diagnosis not present

## 2022-03-19 DIAGNOSIS — M6281 Muscle weakness (generalized): Secondary | ICD-10-CM | POA: Diagnosis not present

## 2022-03-19 DIAGNOSIS — G249 Dystonia, unspecified: Secondary | ICD-10-CM | POA: Diagnosis not present

## 2022-03-23 DIAGNOSIS — G249 Dystonia, unspecified: Secondary | ICD-10-CM | POA: Diagnosis not present

## 2022-03-23 DIAGNOSIS — M6281 Muscle weakness (generalized): Secondary | ICD-10-CM | POA: Diagnosis not present

## 2022-03-25 ENCOUNTER — Ambulatory Visit: Payer: Medicare PPO | Admitting: Podiatry

## 2022-03-25 DIAGNOSIS — M6281 Muscle weakness (generalized): Secondary | ICD-10-CM | POA: Diagnosis not present

## 2022-03-25 DIAGNOSIS — I70293 Other atherosclerosis of native arteries of extremities, bilateral legs: Secondary | ICD-10-CM | POA: Diagnosis not present

## 2022-03-25 DIAGNOSIS — G249 Dystonia, unspecified: Secondary | ICD-10-CM | POA: Diagnosis not present

## 2022-03-25 DIAGNOSIS — B351 Tinea unguium: Secondary | ICD-10-CM | POA: Diagnosis not present

## 2022-03-30 DIAGNOSIS — M6281 Muscle weakness (generalized): Secondary | ICD-10-CM | POA: Diagnosis not present

## 2022-03-30 DIAGNOSIS — G249 Dystonia, unspecified: Secondary | ICD-10-CM | POA: Diagnosis not present

## 2022-04-01 DIAGNOSIS — G249 Dystonia, unspecified: Secondary | ICD-10-CM | POA: Diagnosis not present

## 2022-04-01 DIAGNOSIS — M6281 Muscle weakness (generalized): Secondary | ICD-10-CM | POA: Diagnosis not present

## 2022-04-06 DIAGNOSIS — M6281 Muscle weakness (generalized): Secondary | ICD-10-CM | POA: Diagnosis not present

## 2022-04-06 DIAGNOSIS — G249 Dystonia, unspecified: Secondary | ICD-10-CM | POA: Diagnosis not present

## 2022-04-09 DIAGNOSIS — G249 Dystonia, unspecified: Secondary | ICD-10-CM | POA: Diagnosis not present

## 2022-04-09 DIAGNOSIS — M6281 Muscle weakness (generalized): Secondary | ICD-10-CM | POA: Diagnosis not present

## 2022-04-13 DIAGNOSIS — Z23 Encounter for immunization: Secondary | ICD-10-CM | POA: Diagnosis not present

## 2022-04-13 DIAGNOSIS — E782 Mixed hyperlipidemia: Secondary | ICD-10-CM | POA: Diagnosis not present

## 2022-04-13 DIAGNOSIS — I129 Hypertensive chronic kidney disease with stage 1 through stage 4 chronic kidney disease, or unspecified chronic kidney disease: Secondary | ICD-10-CM | POA: Diagnosis not present

## 2022-04-13 DIAGNOSIS — G249 Dystonia, unspecified: Secondary | ICD-10-CM | POA: Diagnosis not present

## 2022-04-13 DIAGNOSIS — N183 Chronic kidney disease, stage 3 unspecified: Secondary | ICD-10-CM | POA: Diagnosis not present

## 2022-04-14 DIAGNOSIS — G249 Dystonia, unspecified: Secondary | ICD-10-CM | POA: Diagnosis not present

## 2022-04-14 DIAGNOSIS — M6281 Muscle weakness (generalized): Secondary | ICD-10-CM | POA: Diagnosis not present

## 2022-04-16 DIAGNOSIS — G249 Dystonia, unspecified: Secondary | ICD-10-CM | POA: Diagnosis not present

## 2022-04-16 DIAGNOSIS — M6281 Muscle weakness (generalized): Secondary | ICD-10-CM | POA: Diagnosis not present

## 2022-04-22 DIAGNOSIS — M6281 Muscle weakness (generalized): Secondary | ICD-10-CM | POA: Diagnosis not present

## 2022-04-22 DIAGNOSIS — G249 Dystonia, unspecified: Secondary | ICD-10-CM | POA: Diagnosis not present

## 2022-04-24 DIAGNOSIS — M6281 Muscle weakness (generalized): Secondary | ICD-10-CM | POA: Diagnosis not present

## 2022-04-24 DIAGNOSIS — G249 Dystonia, unspecified: Secondary | ICD-10-CM | POA: Diagnosis not present

## 2022-04-28 DIAGNOSIS — G249 Dystonia, unspecified: Secondary | ICD-10-CM | POA: Diagnosis not present

## 2022-04-28 DIAGNOSIS — M6281 Muscle weakness (generalized): Secondary | ICD-10-CM | POA: Diagnosis not present

## 2022-05-05 DIAGNOSIS — M6281 Muscle weakness (generalized): Secondary | ICD-10-CM | POA: Diagnosis not present

## 2022-05-05 DIAGNOSIS — G249 Dystonia, unspecified: Secondary | ICD-10-CM | POA: Diagnosis not present

## 2022-05-07 DIAGNOSIS — G249 Dystonia, unspecified: Secondary | ICD-10-CM | POA: Diagnosis not present

## 2022-05-07 DIAGNOSIS — M6281 Muscle weakness (generalized): Secondary | ICD-10-CM | POA: Diagnosis not present

## 2022-05-12 DIAGNOSIS — G249 Dystonia, unspecified: Secondary | ICD-10-CM | POA: Diagnosis not present

## 2022-05-12 DIAGNOSIS — M6281 Muscle weakness (generalized): Secondary | ICD-10-CM | POA: Diagnosis not present

## 2022-05-14 DIAGNOSIS — M6281 Muscle weakness (generalized): Secondary | ICD-10-CM | POA: Diagnosis not present

## 2022-05-14 DIAGNOSIS — G249 Dystonia, unspecified: Secondary | ICD-10-CM | POA: Diagnosis not present

## 2022-05-20 DIAGNOSIS — M6281 Muscle weakness (generalized): Secondary | ICD-10-CM | POA: Diagnosis not present

## 2022-05-20 DIAGNOSIS — G249 Dystonia, unspecified: Secondary | ICD-10-CM | POA: Diagnosis not present

## 2022-05-21 DIAGNOSIS — M6281 Muscle weakness (generalized): Secondary | ICD-10-CM | POA: Diagnosis not present

## 2022-05-21 DIAGNOSIS — G249 Dystonia, unspecified: Secondary | ICD-10-CM | POA: Diagnosis not present

## 2022-05-27 DIAGNOSIS — G249 Dystonia, unspecified: Secondary | ICD-10-CM | POA: Diagnosis not present

## 2022-05-27 DIAGNOSIS — M6281 Muscle weakness (generalized): Secondary | ICD-10-CM | POA: Diagnosis not present

## 2022-05-28 DIAGNOSIS — G249 Dystonia, unspecified: Secondary | ICD-10-CM | POA: Diagnosis not present

## 2022-05-28 DIAGNOSIS — M6281 Muscle weakness (generalized): Secondary | ICD-10-CM | POA: Diagnosis not present

## 2022-06-01 DIAGNOSIS — M6281 Muscle weakness (generalized): Secondary | ICD-10-CM | POA: Diagnosis not present

## 2022-06-01 DIAGNOSIS — G249 Dystonia, unspecified: Secondary | ICD-10-CM | POA: Diagnosis not present

## 2022-06-03 DIAGNOSIS — M6281 Muscle weakness (generalized): Secondary | ICD-10-CM | POA: Diagnosis not present

## 2022-06-03 DIAGNOSIS — G249 Dystonia, unspecified: Secondary | ICD-10-CM | POA: Diagnosis not present

## 2022-06-08 DIAGNOSIS — G249 Dystonia, unspecified: Secondary | ICD-10-CM | POA: Diagnosis not present

## 2022-06-08 DIAGNOSIS — M6281 Muscle weakness (generalized): Secondary | ICD-10-CM | POA: Diagnosis not present

## 2022-06-11 DIAGNOSIS — M6281 Muscle weakness (generalized): Secondary | ICD-10-CM | POA: Diagnosis not present

## 2022-06-11 DIAGNOSIS — G249 Dystonia, unspecified: Secondary | ICD-10-CM | POA: Diagnosis not present

## 2022-06-15 DIAGNOSIS — M6281 Muscle weakness (generalized): Secondary | ICD-10-CM | POA: Diagnosis not present

## 2022-06-15 DIAGNOSIS — G249 Dystonia, unspecified: Secondary | ICD-10-CM | POA: Diagnosis not present

## 2022-06-16 DIAGNOSIS — K219 Gastro-esophageal reflux disease without esophagitis: Secondary | ICD-10-CM | POA: Diagnosis not present

## 2022-06-16 DIAGNOSIS — I10 Essential (primary) hypertension: Secondary | ICD-10-CM | POA: Diagnosis not present

## 2022-06-16 DIAGNOSIS — E782 Mixed hyperlipidemia: Secondary | ICD-10-CM | POA: Diagnosis not present

## 2022-06-16 DIAGNOSIS — R5383 Other fatigue: Secondary | ICD-10-CM | POA: Diagnosis not present

## 2022-06-16 DIAGNOSIS — R269 Unspecified abnormalities of gait and mobility: Secondary | ICD-10-CM | POA: Diagnosis not present

## 2022-06-16 DIAGNOSIS — N1831 Chronic kidney disease, stage 3a: Secondary | ICD-10-CM | POA: Diagnosis not present

## 2022-06-16 DIAGNOSIS — G249 Dystonia, unspecified: Secondary | ICD-10-CM | POA: Diagnosis not present

## 2022-06-16 DIAGNOSIS — R296 Repeated falls: Secondary | ICD-10-CM | POA: Diagnosis not present

## 2022-06-16 DIAGNOSIS — I129 Hypertensive chronic kidney disease with stage 1 through stage 4 chronic kidney disease, or unspecified chronic kidney disease: Secondary | ICD-10-CM | POA: Diagnosis not present

## 2022-06-16 DIAGNOSIS — R2689 Other abnormalities of gait and mobility: Secondary | ICD-10-CM | POA: Diagnosis not present

## 2022-06-17 DIAGNOSIS — G249 Dystonia, unspecified: Secondary | ICD-10-CM | POA: Diagnosis not present

## 2022-06-17 DIAGNOSIS — M6281 Muscle weakness (generalized): Secondary | ICD-10-CM | POA: Diagnosis not present

## 2022-06-22 DIAGNOSIS — G249 Dystonia, unspecified: Secondary | ICD-10-CM | POA: Diagnosis not present

## 2022-06-22 DIAGNOSIS — M6281 Muscle weakness (generalized): Secondary | ICD-10-CM | POA: Diagnosis not present

## 2022-06-24 DIAGNOSIS — M6281 Muscle weakness (generalized): Secondary | ICD-10-CM | POA: Diagnosis not present

## 2022-06-24 DIAGNOSIS — G249 Dystonia, unspecified: Secondary | ICD-10-CM | POA: Diagnosis not present

## 2022-06-27 DIAGNOSIS — G8220 Paraplegia, unspecified: Secondary | ICD-10-CM | POA: Diagnosis not present

## 2022-06-29 DIAGNOSIS — G249 Dystonia, unspecified: Secondary | ICD-10-CM | POA: Diagnosis not present

## 2022-06-29 DIAGNOSIS — M6281 Muscle weakness (generalized): Secondary | ICD-10-CM | POA: Diagnosis not present

## 2022-07-02 DIAGNOSIS — G249 Dystonia, unspecified: Secondary | ICD-10-CM | POA: Diagnosis not present

## 2022-07-02 DIAGNOSIS — M6281 Muscle weakness (generalized): Secondary | ICD-10-CM | POA: Diagnosis not present

## 2022-07-06 DIAGNOSIS — M6281 Muscle weakness (generalized): Secondary | ICD-10-CM | POA: Diagnosis not present

## 2022-07-06 DIAGNOSIS — G249 Dystonia, unspecified: Secondary | ICD-10-CM | POA: Diagnosis not present

## 2022-07-09 DIAGNOSIS — G249 Dystonia, unspecified: Secondary | ICD-10-CM | POA: Diagnosis not present

## 2022-07-09 DIAGNOSIS — M6281 Muscle weakness (generalized): Secondary | ICD-10-CM | POA: Diagnosis not present

## 2022-07-13 DIAGNOSIS — M6281 Muscle weakness (generalized): Secondary | ICD-10-CM | POA: Diagnosis not present

## 2022-07-13 DIAGNOSIS — G249 Dystonia, unspecified: Secondary | ICD-10-CM | POA: Diagnosis not present

## 2022-07-15 DIAGNOSIS — G249 Dystonia, unspecified: Secondary | ICD-10-CM | POA: Diagnosis not present

## 2022-07-15 DIAGNOSIS — M6281 Muscle weakness (generalized): Secondary | ICD-10-CM | POA: Diagnosis not present

## 2022-07-20 DIAGNOSIS — M6281 Muscle weakness (generalized): Secondary | ICD-10-CM | POA: Diagnosis not present

## 2022-07-20 DIAGNOSIS — G249 Dystonia, unspecified: Secondary | ICD-10-CM | POA: Diagnosis not present

## 2022-07-21 DIAGNOSIS — H26493 Other secondary cataract, bilateral: Secondary | ICD-10-CM | POA: Diagnosis not present

## 2022-07-21 DIAGNOSIS — Z135 Encounter for screening for eye and ear disorders: Secondary | ICD-10-CM | POA: Diagnosis not present

## 2022-07-21 DIAGNOSIS — H524 Presbyopia: Secondary | ICD-10-CM | POA: Diagnosis not present

## 2022-07-23 DIAGNOSIS — G249 Dystonia, unspecified: Secondary | ICD-10-CM | POA: Diagnosis not present

## 2022-07-23 DIAGNOSIS — M6281 Muscle weakness (generalized): Secondary | ICD-10-CM | POA: Diagnosis not present

## 2022-07-27 DIAGNOSIS — G249 Dystonia, unspecified: Secondary | ICD-10-CM | POA: Diagnosis not present

## 2022-07-27 DIAGNOSIS — M6281 Muscle weakness (generalized): Secondary | ICD-10-CM | POA: Diagnosis not present

## 2022-07-29 DIAGNOSIS — M6281 Muscle weakness (generalized): Secondary | ICD-10-CM | POA: Diagnosis not present

## 2022-07-29 DIAGNOSIS — G249 Dystonia, unspecified: Secondary | ICD-10-CM | POA: Diagnosis not present

## 2022-08-03 DIAGNOSIS — M6281 Muscle weakness (generalized): Secondary | ICD-10-CM | POA: Diagnosis not present

## 2022-08-03 DIAGNOSIS — G249 Dystonia, unspecified: Secondary | ICD-10-CM | POA: Diagnosis not present

## 2022-08-05 DIAGNOSIS — M6281 Muscle weakness (generalized): Secondary | ICD-10-CM | POA: Diagnosis not present

## 2022-08-05 DIAGNOSIS — G249 Dystonia, unspecified: Secondary | ICD-10-CM | POA: Diagnosis not present

## 2022-08-10 DIAGNOSIS — G249 Dystonia, unspecified: Secondary | ICD-10-CM | POA: Diagnosis not present

## 2022-08-10 DIAGNOSIS — M6281 Muscle weakness (generalized): Secondary | ICD-10-CM | POA: Diagnosis not present

## 2022-08-12 DIAGNOSIS — G249 Dystonia, unspecified: Secondary | ICD-10-CM | POA: Diagnosis not present

## 2022-08-12 DIAGNOSIS — M6281 Muscle weakness (generalized): Secondary | ICD-10-CM | POA: Diagnosis not present

## 2022-08-17 DIAGNOSIS — M6281 Muscle weakness (generalized): Secondary | ICD-10-CM | POA: Diagnosis not present

## 2022-08-17 DIAGNOSIS — G249 Dystonia, unspecified: Secondary | ICD-10-CM | POA: Diagnosis not present

## 2022-08-20 DIAGNOSIS — M6281 Muscle weakness (generalized): Secondary | ICD-10-CM | POA: Diagnosis not present

## 2022-08-20 DIAGNOSIS — G249 Dystonia, unspecified: Secondary | ICD-10-CM | POA: Diagnosis not present

## 2022-08-24 DIAGNOSIS — M6281 Muscle weakness (generalized): Secondary | ICD-10-CM | POA: Diagnosis not present

## 2022-08-24 DIAGNOSIS — G249 Dystonia, unspecified: Secondary | ICD-10-CM | POA: Diagnosis not present

## 2022-08-26 DIAGNOSIS — G249 Dystonia, unspecified: Secondary | ICD-10-CM | POA: Diagnosis not present

## 2022-08-26 DIAGNOSIS — M6281 Muscle weakness (generalized): Secondary | ICD-10-CM | POA: Diagnosis not present

## 2022-09-01 DIAGNOSIS — M6281 Muscle weakness (generalized): Secondary | ICD-10-CM | POA: Diagnosis not present

## 2022-09-01 DIAGNOSIS — G249 Dystonia, unspecified: Secondary | ICD-10-CM | POA: Diagnosis not present

## 2022-09-03 DIAGNOSIS — G249 Dystonia, unspecified: Secondary | ICD-10-CM | POA: Diagnosis not present

## 2022-09-03 DIAGNOSIS — M6281 Muscle weakness (generalized): Secondary | ICD-10-CM | POA: Diagnosis not present

## 2022-09-07 DIAGNOSIS — M6281 Muscle weakness (generalized): Secondary | ICD-10-CM | POA: Diagnosis not present

## 2022-09-07 DIAGNOSIS — G249 Dystonia, unspecified: Secondary | ICD-10-CM | POA: Diagnosis not present
